# Patient Record
Sex: Male | Born: 1965
Health system: Southern US, Community
[De-identification: ages and names within clinical notes are randomized; demographics above are authoritative.]

## PROBLEM LIST (undated history)

## (undated) DIAGNOSIS — M549 Dorsalgia, unspecified: Secondary | ICD-10-CM

## (undated) DIAGNOSIS — E785 Hyperlipidemia, unspecified: Secondary | ICD-10-CM

## (undated) DIAGNOSIS — G8929 Other chronic pain: Secondary | ICD-10-CM

## (undated) DIAGNOSIS — G4730 Sleep apnea, unspecified: Secondary | ICD-10-CM

## (undated) DIAGNOSIS — I10 Essential (primary) hypertension: Secondary | ICD-10-CM

## (undated) HISTORY — DX: Sleep apnea, unspecified: G47.30

## (undated) HISTORY — DX: Essential (primary) hypertension: I10

## (undated) HISTORY — DX: Hyperlipidemia, unspecified: E78.5

---
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
## 2014-07-22 ENCOUNTER — Emergency Department (HOSPITAL_COMMUNITY)
Admission: EM | Admit: 2014-07-22 | Discharge: 2014-07-22 | Disposition: A | Discharge status: Home / Self Care | Payer: Self-pay | Attending: Emergency Medicine | Admitting: Emergency Medicine

## 2014-07-22 ENCOUNTER — Encounter (HOSPITAL_COMMUNITY): Payer: Self-pay | Admitting: *Deleted

## 2014-07-22 DIAGNOSIS — J069 Acute upper respiratory infection, unspecified: Secondary | ICD-10-CM | POA: Insufficient documentation

## 2014-07-22 DIAGNOSIS — G8929 Other chronic pain: Secondary | ICD-10-CM | POA: Insufficient documentation

## 2014-07-22 DIAGNOSIS — Z87891 Personal history of nicotine dependence: Secondary | ICD-10-CM | POA: Insufficient documentation

## 2014-07-22 HISTORY — DX: Dorsalgia, unspecified: M54.9

## 2014-07-22 HISTORY — DX: Other chronic pain: G89.29

## 2014-07-22 NOTE — Discharge Instructions (Signed)
Upper Respiratory Infection, Adult °An upper respiratory infection (URI) is also sometimes known as the common cold. The upper respiratory tract includes the nose, sinuses, throat, trachea, and bronchi. Bronchi are the airways leading to the lungs. Most people improve within 1 week, but symptoms can last up to 2 weeks. A residual cough may last even longer.  °CAUSES °Many different viruses can infect the tissues lining the upper respiratory tract. The tissues become irritated and inflamed and often become very moist. Mucus production is also common. A cold is contagious. You can easily spread the virus to others by oral contact. This includes kissing, sharing a glass, coughing, or sneezing. Touching your mouth or nose and then touching a surface, which is then touched by another person, can also spread the virus. °SYMPTOMS  °Symptoms typically develop 1 to 3 days after you come in contact with a cold virus. Symptoms vary from person to person. They may include: °· Runny nose. °· Sneezing. °· Nasal congestion. °· Sinus irritation. °· Sore throat. °· Loss of voice (laryngitis). °· Cough. °· Fatigue. °· Muscle aches. °· Loss of appetite. °· Headache. °· Low-grade fever. °DIAGNOSIS  °You might diagnose your own cold based on familiar symptoms, since most people get a cold 2 to 3 times a year. Your caregiver can confirm this based on your exam. Most importantly, your caregiver can check that your symptoms are not due to another disease such as strep throat, sinusitis, pneumonia, asthma, or epiglottitis. Blood tests, throat tests, and X-rays are not necessary to diagnose a common cold, but they may sometimes be helpful in excluding other more serious diseases. Your caregiver will decide if any further tests are required. °RISKS AND COMPLICATIONS  °You may be at risk for a more severe case of the common cold if you smoke cigarettes, have chronic heart disease (such as heart failure) or lung disease (such as asthma), or if  you have a weakened immune system. The very young and very old are also at risk for more serious infections. Bacterial sinusitis, middle ear infections, and bacterial pneumonia can complicate the common cold. The common cold can worsen asthma and chronic obstructive pulmonary disease (COPD). Sometimes, these complications can require emergency medical care and may be life-threatening. °PREVENTION  °The best way to protect against getting a cold is to practice good hygiene. Avoid oral or hand contact with people with cold symptoms. Wash your hands often if contact occurs. There is no clear evidence that vitamin C, vitamin E, echinacea, or exercise reduces the chance of developing a cold. However, it is always recommended to get plenty of rest and practice good nutrition. °TREATMENT  °Treatment is directed at relieving symptoms. There is no cure. Antibiotics are not effective, because the infection is caused by a virus, not by bacteria. Treatment may include: °· Increased fluid intake. Sports drinks offer valuable electrolytes, sugars, and fluids. °· Breathing heated mist or steam (vaporizer or shower). °· Eating chicken soup or other clear broths, and maintaining good nutrition. °· Getting plenty of rest. °· Using gargles or lozenges for comfort. °· Controlling fevers with ibuprofen or acetaminophen as directed by your caregiver. °· Increasing usage of your inhaler if you have asthma. °Zinc gel and zinc lozenges, taken in the first 24 hours of the common cold, can shorten the duration and lessen the severity of symptoms. Pain medicines may help with fever, muscle aches, and throat pain. A variety of non-prescription medicines are available to treat congestion and runny nose. Your caregiver   can make recommendations and may suggest nasal or lung inhalers for other symptoms.  HOME CARE INSTRUCTIONS   Only take over-the-counter or prescription medicines for pain, discomfort, or fever as directed by your  caregiver.  Use a warm mist humidifier or inhale steam from a shower to increase air moisture. This may keep secretions moist and make it easier to breathe.  Drink enough water and fluids to keep your urine clear or pale yellow.  Rest as needed.  Return to work when your temperature has returned to normal or as your caregiver advises. You may need to stay home longer to avoid infecting others. You can also use a face mask and careful hand washing to prevent spread of the virus. SEEK MEDICAL CARE IF:   After the first few days, you feel you are getting worse rather than better.  You need your caregiver's advice about medicines to control symptoms.  You develop chills, worsening shortness of breath, or brown or red sputum. These may be signs of pneumonia.  You develop yellow or brown nasal discharge or pain in the face, especially when you bend forward. These may be signs of sinusitis.  You develop a fever, swollen neck glands, pain with swallowing, or white areas in the back of your throat. These may be signs of strep throat. SEEK IMMEDIATE MEDICAL CARE IF:   You have a fever.  You develop severe or persistent headache, ear pain, sinus pain, or chest pain.  You develop wheezing, a prolonged cough, cough up blood, or have a change in your usual mucus (if you have chronic lung disease).  You develop sore muscles or a stiff neck. Document Released: 10/11/2000 Document Revised: 07/10/2011 Document Reviewed: 07/23/2013 [facility] Patient Information 2015 [place], [place]. This information is not intended to replace advice given to you by your health care provider. Make sure you discuss any questions you have with your health care provider.  As discussed,  Rest and make sure you are drinking plenty of fluids.  Look for coricidin brand cold and congestion medication for your nasal congestion - this will not interfere with blood pressure.  Also, vicks vapor stick or rub, menthol cough drops or a  strong mint (such as altoids) can help relieve your congestion.  There is no sign of a sinus or bacterial infection today.  I suspect your symptoms are viral and should improve with time.

## 2014-07-22 NOTE — ED Provider Notes (Signed)
CSN: [id number]     Arrival date & time 07/22/14  1053 History   First MD Initiated Contact with Patient 07/22/14 1109     Chief Complaint  Patient presents with  . Nasal Congestion     (Consider location/radiation/quality/duration/timing/severity/associated sxs/prior Treatment) The history is provided by the patient.   Jon Washington is a 49 y.o. male presenting with a 2 day history of uri type symptoms which includes nasal congestion with clear rhinorrhea, sinus pressure, sore throat, low grade subjective fever with body aches and nonproductive cough.  Symptoms due to not include shortness of breath, chest pain, headache, Nausea, vomiting or diarrhea.  The patient has taken no medicines prior to arrival.    Past Medical History  Diagnosis Date  . Chronic back pain    Past Surgical History  Procedure Laterality Date  . Hernia repair    . Appendectomy     No family history on file. History  Substance Use Topics  . Smoking status: Former Smoker    Types: Cigarettes  . Smokeless tobacco: Not on file  . Alcohol Use: No    Review of Systems  Constitutional: Positive for fever and chills.  HENT: Positive for congestion, rhinorrhea, sinus pressure and sore throat. Negative for ear pain, trouble swallowing and voice change.   Eyes: Negative for discharge.  Respiratory: Positive for cough. Negative for shortness of breath, wheezing and stridor.   Cardiovascular: Negative for chest pain.  Gastrointestinal: Negative for abdominal pain.  Genitourinary: Negative.   Neurological: Negative for headaches.      Allergies  Review of patient's allergies indicates no known allergies.  Home Medications   Prior to Admission medications   Not on File   BP 152/92 mmHg  Pulse 86  Temp(Src) 97.9 F (36.6 C) (Oral)  Resp 16  Ht 6\' 1"  (1.854 m)  Wt 260 lb (117.935 kg)  BMI 34.31 kg/m2  SpO2 100% Physical Exam  Constitutional: He is oriented to person, place, and time. He  appears well-developed and well-nourished.  HENT:  Head: Normocephalic and atraumatic.  Right Ear: Tympanic membrane and ear canal normal.  Left Ear: Tympanic membrane and ear canal normal.  Nose: Mucosal edema and rhinorrhea present.  Mouth/Throat: Uvula is midline, oropharynx is clear and moist and mucous membranes are normal. No oropharyngeal exudate, posterior oropharyngeal edema, posterior oropharyngeal erythema or tonsillar abscesses.  No sinus pain with palpation.  Eyes: Conjunctivae are normal.  Cardiovascular: Normal rate and normal heart sounds.   Pulmonary/Chest: Effort normal. No respiratory distress. He has no wheezes. He has no rales.  Abdominal: Soft. There is no tenderness.  Musculoskeletal: Normal range of motion.  Neurological: He is alert and oriented to person, place, and time.  Skin: Skin is warm and dry. No rash noted.  Psychiatric: He has a normal mood and affect.    ED Course  Procedures (including critical care time) Labs Review Labs Reviewed - No data to display  Imaging Review No results found.   EKG Interpretation None      MDM   Final diagnoses:  Acute URI    Pt with uri complaints, suspect viral source.  Advised rest, increased fluid intake.  Suggested coricidin d which will not interfere with bp.  vicks vapor stick, menthol cough lozenges, time.  The patient appears reasonably screened and/or stabilized for discharge and I doubt any other medical condition or other [facility] requiring further screening, evaluation, or treatment in the ED at this time prior to discharge.  Burgess Amor, PA-C 07/22/14 1643  Margarita Grizzle, MD 07/23/14 [phone]

## 2014-07-22 NOTE — ED Notes (Signed)
Pt states bodyaches, sinus pressure, fever , stating going between being hot and chills. Symptoms since Sunday. Drainage is yellow in color. Pt states he will need a doctors note for work today also.

## 2014-08-01 ENCOUNTER — Emergency Department (HOSPITAL_COMMUNITY)
Admission: EM | Admit: 2014-08-01 | Discharge: 2014-08-01 | Disposition: A | Discharge status: Home / Self Care | Payer: Self-pay | Attending: Emergency Medicine | Admitting: Emergency Medicine

## 2014-08-01 ENCOUNTER — Emergency Department (HOSPITAL_COMMUNITY): Payer: Self-pay

## 2014-08-01 ENCOUNTER — Encounter (HOSPITAL_COMMUNITY): Payer: Self-pay

## 2014-08-01 DIAGNOSIS — G8929 Other chronic pain: Secondary | ICD-10-CM | POA: Insufficient documentation

## 2014-08-01 DIAGNOSIS — B349 Viral infection, unspecified: Secondary | ICD-10-CM

## 2014-08-01 DIAGNOSIS — Z87891 Personal history of nicotine dependence: Secondary | ICD-10-CM | POA: Insufficient documentation

## 2014-08-01 MED ORDER — ALBUTEROL SULFATE (2.5 MG/3ML) 0.083% IN NEBU
2.5000 mg | INHALATION_SOLUTION | Freq: Once | RESPIRATORY_TRACT | Status: AC
  Administered 2014-08-01: 2.5 mg via RESPIRATORY_TRACT
  Filled 2014-08-01: qty 3

## 2014-08-01 MED ORDER — ALBUTEROL SULFATE HFA 108 (90 BASE) MCG/ACT IN AERS
2.0000 | INHALATION_SPRAY | Freq: Once | RESPIRATORY_TRACT | Status: AC
  Administered 2014-08-01: 2 via RESPIRATORY_TRACT
  Filled 2014-08-01: qty 6.7

## 2014-08-01 MED ORDER — PREDNISONE 50 MG PO TABS
60.0000 mg | ORAL_TABLET | Freq: Once | ORAL | Status: AC
  Administered 2014-08-01: 60 mg via ORAL
  Filled 2014-08-01 (×2): qty 1

## 2014-08-01 MED ORDER — PREDNISONE 50 MG PO TABS: ORAL_TABLET | ORAL | Status: DC

## 2014-08-01 MED ORDER — IPRATROPIUM-ALBUTEROL 0.5-2.5 (3) MG/3ML IN SOLN
3.0000 mL | Freq: Once | RESPIRATORY_TRACT | Status: AC
  Administered 2014-08-01: 3 mL via RESPIRATORY_TRACT
  Filled 2014-08-01: qty 3

## 2014-08-01 NOTE — ED Notes (Signed)
Pt c/o of generalized body aches, productive yellow sputum and temp at home.

## 2014-08-01 NOTE — Discharge Instructions (Signed)
X-ray shows no pneumonia. Increase fluids. Alternate Tylenol and ibuprofen every 3 hours. Use your inhaler 2 puffs every 3 hours. Prescription for prednisone. Start tomorrow.

## 2014-08-01 NOTE — ED Provider Notes (Signed)
CSN: [id number]     Arrival date & time 08/01/14  1004 History  This chart was scribed for Donnetta HutchingBrian Inocente Krach, MD by Jarvis Morganaylor Ferguson, ED Scribe. This patient was seen in room APA11/APA11 and the patient's care was started at 10:33 AM.     Chief Complaint  Patient presents with  . Generalized Body Aches    The history is provided by the patient. No language interpreter was used.    HPI Comments: Jon Washington is a 49 y.o. male who presents to the Emergency Department complaining of  generalized body aches that began 3 days ago. He is having associated productive cough, wheezing, fever (t-max 103 F), chills and sweats. Pt states his daughter has pneumonia and his wife has the flu. He denies any health issues. Pt reports he took Tylenol yesterday which provided relief for the fever. He denies any sore throat or otalgia  Past Medical History  Diagnosis Date  . Chronic back pain    Past Surgical History  Procedure Laterality Date  . Hernia repair    . Appendectomy     History reviewed. No pertinent family history. History  Substance Use Topics  . Smoking status: Former Smoker    Types: Cigarettes  . Smokeless tobacco: Not on file  . Alcohol Use: No    Review of Systems  A complete 10 system review of systems was obtained and all systems are negative except as noted in the HPI and PMH.     Allergies  Review of patient's allergies indicates no known allergies.  Home Medications   Prior to Admission medications   Medication Sig Start Date End Date Taking? Authorizing Provider  predniSONE (DELTASONE) 50 MG tablet 1 tablet for 6 days, one half tablet for 6 days 08/01/14   Donnetta HutchingBrian Unity Luepke, MD   Triage Vitals: BP 143/82 mmHg  Pulse 99  Temp(Src) 98 F (36.7 C) (Oral)  Resp 16  Ht 6\' 1"  (1.854 m)  Wt 236 lb (107.049 kg)  BMI 31.14 kg/m2  SpO2 100%  Physical Exam  Constitutional: He is oriented to person, place, and time. He appears well-developed and well-nourished.  HENT:  Head:  Normocephalic and atraumatic.  Eyes: Conjunctivae and EOM are normal. Pupils are equal, round, and reactive to light.  Neck: Normal range of motion. Neck supple.  Cardiovascular: Normal rate and regular rhythm.   Pulmonary/Chest: Effort normal and breath sounds normal.  Abdominal: Soft. Bowel sounds are normal.  Musculoskeletal: Normal range of motion.  Neurological: He is alert and oriented to person, place, and time.  Skin: Skin is warm and dry.  Psychiatric: He has a normal mood and affect. His behavior is normal.  Nursing note and vitals reviewed.   ED Course  Procedures (including critical care time)  DIAGNOSTIC STUDIES: Oxygen Saturation is 100% on RA, normal by my interpretation.    COORDINATION OF CARE: 10:38 AM Advised fluids, rest and alternating Tylenol and Ibuprofen for his symptoms. Will CXR and breathing treatment  Labs Review Labs Reviewed - No data to display  Imaging Review Dg Chest 2 View  08/01/2014   CLINICAL DATA:  Generalized body aches, fever and cough.  EXAM: CHEST  2 VIEW  COMPARISON:  None.  FINDINGS: Few prominent densities in the right lower chest probably represent overlying shadows. Otherwise, the lungs are clear. Heart size is normal. Negative for pleural effusions. No acute bone abnormality.  IMPRESSION: No active cardiopulmonary disease.   Electronically Signed   By: Meriel PicaAdam  Henn M.D.  On: 08/01/2014 11:46    MDM   Final diagnoses:  Viral syndrome   Patient is nontoxic-appearing. Chest x-ray negative. Vital signs stable. Albuterol/Atrovent nebulizer treatment given. Discharge medications prednisone and albuterol inhaler.  I personally performed the services described in this documentation, which was scribed in my presence. The recorded information has been reviewed and is accurate.     Donnetta Hutching, MD 08/01/14 1209

## 2016-06-12 ENCOUNTER — Encounter: Payer: Self-pay | Admitting: Family Medicine

## 2016-06-12 ENCOUNTER — Ambulatory Visit (INDEPENDENT_AMBULATORY_CARE_PROVIDER_SITE_OTHER): Payer: Medicaid Other | Admitting: Family Medicine

## 2016-06-12 DIAGNOSIS — R030 Elevated blood-pressure reading, without diagnosis of hypertension: Secondary | ICD-10-CM | POA: Diagnosis not present

## 2016-06-12 DIAGNOSIS — E663 Overweight: Secondary | ICD-10-CM | POA: Diagnosis not present

## 2016-06-12 DIAGNOSIS — E669 Obesity, unspecified: Secondary | ICD-10-CM | POA: Insufficient documentation

## 2016-06-12 DIAGNOSIS — Z7689 Persons encountering health services in other specified circumstances: Secondary | ICD-10-CM | POA: Diagnosis not present

## 2016-06-12 DIAGNOSIS — Z8249 Family history of ischemic heart disease and other diseases of the circulatory system: Secondary | ICD-10-CM

## 2016-06-12 DIAGNOSIS — R7309 Other abnormal glucose: Secondary | ICD-10-CM

## 2016-06-12 NOTE — Patient Instructions (Signed)
Come back for a physical and EKG Need blood work and a chest x ray  Exercise every day that you are able  We will schedule colon cancer screening

## 2016-06-12 NOTE — Progress Notes (Addendum)
Chief Complaint  Patient presents with  . Establish Care   New patient to establish. No old records are available. He hasn't seen a doctor for 8-10 years. He states he previously had hypertension but is no longer on medication. He is currently unemployed. He states that he hasn't been as active since not working. He has gained weight. He knows that he is overweight. He has no specific complaint. Known rotator cuff disease on the right. Occasional shoulder pain. He has not had colon cancer screening. He has not had any immunizations recently. Because his sister died in her sleep at age 35, she had known coronary artery disease. He had another family member die in their 60s from a heart attack. He understands that with a family history of heart disease at young age he needs to be screened for this. He has never been tested for or treated for hyperlipidemia. He has never been told he is diabetic. He was a smoker previously but quit many years ago. He does live with a smoker. He is not on any particular diet. He does not get any regular exercise.  Patient Active Problem List   Diagnosis Date Noted  . Family history of heart disease in male family member before age 49 06/12/2016  . Over weight 06/12/2016  . Elevated blood pressure reading 06/12/2016  . Encounter to establish care with new doctor 06/12/2016   Medications: None  Past Medical History:  Diagnosis Date  . Chronic back pain   . Hypertension   . Sleep apnea     Past Surgical History:  Procedure Laterality Date  . APPENDECTOMY  1974  . HERNIA REPAIR  1978    Social History   Social History  . Marital status: Married    Spouse name: Marisue Ivan  . Number of children: 3- all stepchildren   . Years of education: 32   Occupational History  . Holiday representative    Social History Main Topics  . Smoking status: Former Smoker    Packs/day: 1.00    Types: Cigarettes    Start date: 05/01/1980    Quit date: 05/01/1997  . Smokeless  tobacco: Never Used  . Alcohol use No  . Drug use: No  . Sexual activity: Yes    Birth control/ protection: None     Comment: wife PCOS   Other Topics Concern  . Not on file   Social History Narrative   Married   Wife [name] at home age 77   Veteran - [employer] 6 years    Family History  Problem Relation Age of Onset  . Thyroid disease Mother   . Alcohol abuse Father   . Arthritis Father   . Cancer Father     lymphoma  . COPD Father   . Heart disease Father     irreg beat  . Heart disease Sister 2    CAD  . Hypertension Sister   . Cancer Sister     hodgkins, breast, skin  . Breast cancer Sister   . Heart disease Maternal Uncle   . Mental illness Maternal Grandmother     social anxiety  . Heart disease Paternal Grandfather 55    heart attack    Review of Systems  Constitutional: Negative for chills, fever and weight loss.  HENT: Negative for congestion and hearing loss.   Eyes: Negative.  Negative for blurred vision and pain.       Wears glasses  Respiratory: Negative for cough and  shortness of breath.   Cardiovascular: Negative for chest pain and leg swelling.  Gastrointestinal: Negative for abdominal pain, constipation, diarrhea and heartburn.  Genitourinary: Negative for dysuria and frequency.  Musculoskeletal: Positive for joint pain. Negative for falls and myalgias.       Shoulder  Neurological: Negative for dizziness, seizures and headaches.  Psychiatric/Behavioral: Negative for depression. The patient is not nervous/anxious and does not have insomnia.     BP (!) 158/90 (BP Location: Right Arm, Patient Position: Sitting, Cuff Size: Large)   Pulse 68   Temp 98.6 F (37 C) (Temporal)   Resp 16   Ht 6\' 1"  (1.854 m)   Wt 262 lb 0.6 oz (118.9 kg)   SpO2 98%   BMI 34.57 kg/m   Physical Exam  Constitutional: He is oriented to person, place, and time. He appears well-developed and well-nourished.  Moderately overweight, rounded abdomen  HENT:  Head:  Normocephalic and atraumatic.  Right Ear: External ear normal.  Left Ear: External ear normal.  Mouth/Throat: Oropharynx is clear and moist.  Eyes: Conjunctivae are normal. Pupils are equal, round, and reactive to light.  Neck: Normal range of motion. Neck supple. No thyromegaly present.  Cardiovascular: Normal rate, regular rhythm and normal heart sounds.   Pulmonary/Chest: Effort normal and breath sounds normal. No respiratory distress.  Abdominal: Soft. Bowel sounds are normal.  Musculoskeletal: Normal range of motion. He exhibits no edema.  Lymphadenopathy:    He has no cervical adenopathy.  Neurological: He is alert and oriented to person, place, and time. He displays normal reflexes.  Gait normal  Skin: Skin is warm and dry.  Psychiatric: He has a normal mood and affect. His behavior is normal. Thought content normal.  Nursing note and vitals reviewed.  ASSESSMENT/PLAN:  1. Family history of heart disease in male family member before age 51  - CBC - Comprehensive metabolic panel - Lipid panel - VITAMIN D 25 Hydroxy (Vit-D Deficiency, Fractures) - Urinalysis, Routine w reflex microscopic - DG Chest 2 View; Future  2. Over weight   3. Elevated blood pressure reading   4. Encounter to establish care with new doctor    Patient Instructions  Come back for a physical and EKG Need blood work and a chest x ray  Exercise every day that you are able  We will schedule colon cancer screening   Eustace MooreYvonne Sue Nelson, MD

## 2016-06-13 ENCOUNTER — Encounter: Payer: Self-pay | Admitting: Family Medicine

## 2016-06-13 ENCOUNTER — Other Ambulatory Visit: Payer: Self-pay

## 2016-06-13 ENCOUNTER — Ambulatory Visit (HOSPITAL_COMMUNITY)
Admission: RE | Admit: 2016-06-13 | Discharge: 2016-06-13 | Disposition: A | Discharge status: Home / Self Care | Payer: Medicaid Other | Source: Ambulatory Visit | Attending: Family Medicine | Admitting: Family Medicine

## 2016-06-13 ENCOUNTER — Telehealth: Payer: Self-pay

## 2016-06-13 DIAGNOSIS — Z8249 Family history of ischemic heart disease and other diseases of the circulatory system: Secondary | ICD-10-CM | POA: Insufficient documentation

## 2016-06-13 DIAGNOSIS — E785 Hyperlipidemia, unspecified: Secondary | ICD-10-CM

## 2016-06-13 DIAGNOSIS — E663 Overweight: Secondary | ICD-10-CM | POA: Insufficient documentation

## 2016-06-13 DIAGNOSIS — Z7689 Persons encountering health services in other specified circumstances: Secondary | ICD-10-CM | POA: Insufficient documentation

## 2016-06-13 DIAGNOSIS — R7309 Other abnormal glucose: Secondary | ICD-10-CM

## 2016-06-13 DIAGNOSIS — I10 Essential (primary) hypertension: Secondary | ICD-10-CM | POA: Diagnosis present

## 2016-06-13 DIAGNOSIS — R030 Elevated blood-pressure reading, without diagnosis of hypertension: Secondary | ICD-10-CM | POA: Insufficient documentation

## 2016-06-13 DIAGNOSIS — E559 Vitamin D deficiency, unspecified: Secondary | ICD-10-CM | POA: Insufficient documentation

## 2016-06-13 DIAGNOSIS — E119 Type 2 diabetes mellitus without complications: Secondary | ICD-10-CM | POA: Insufficient documentation

## 2016-06-13 HISTORY — DX: Hyperlipidemia, unspecified: E78.5

## 2016-06-13 LAB — CBC
HCT: 43.4 % (ref 38.5–50.0)
Hemoglobin: 14.8 g/dL (ref 13.2–17.1)
MCH: 29.7 pg (ref 27.0–33.0)
MCHC: 34.1 g/dL (ref 32.0–36.0)
MCV: 87.1 fL (ref 80.0–100.0)
MPV: 10.6 fL (ref 7.5–12.5)
Platelets: 248 10*3/uL (ref 140–400)
RBC: 4.98 MIL/uL (ref 4.20–5.80)
RDW: 13.7 % (ref 11.0–15.0)
WBC: 7.7 10*3/uL (ref 3.8–10.8)

## 2016-06-13 LAB — COMPREHENSIVE METABOLIC PANEL
ALK PHOS: 67 U/L (ref 40–115)
ALT: 13 U/L (ref 9–46)
AST: 14 U/L (ref 10–35)
Albumin: 4.1 g/dL (ref 3.6–5.1)
BUN: 10 mg/dL (ref 7–25)
CO2: 24 mmol/L (ref 20–31)
CREATININE: 0.91 mg/dL (ref 0.70–1.33)
Calcium: 9.2 mg/dL (ref 8.6–10.3)
Chloride: 105 mmol/L (ref 98–110)
Glucose, Bld: 205 mg/dL — ABNORMAL HIGH (ref 65–99)
Potassium: 4 mmol/L (ref 3.5–5.3)
SODIUM: 138 mmol/L (ref 135–146)
TOTAL PROTEIN: 6.9 g/dL (ref 6.1–8.1)
Total Bilirubin: 0.3 mg/dL (ref 0.2–1.2)

## 2016-06-13 LAB — URINALYSIS, MICROSCOPIC ONLY
Bacteria, UA: NONE SEEN [HPF]
CRYSTALS: NONE SEEN [HPF]
Casts: NONE SEEN [LPF]
RBC / HPF: NONE SEEN RBC/HPF (ref <=2)
Squamous Epithelial / LPF: NONE SEEN [HPF] (ref <=5)
WBC UA: NONE SEEN WBC/HPF (ref <=5)
Yeast: NONE SEEN [HPF]

## 2016-06-13 LAB — URINALYSIS, ROUTINE W REFLEX MICROSCOPIC
Bilirubin Urine: NEGATIVE
Hgb urine dipstick: NEGATIVE
KETONES UR: NEGATIVE
Leukocytes, UA: NEGATIVE
Nitrite: NEGATIVE
Protein, ur: NEGATIVE
Specific Gravity, Urine: 1.021 (ref 1.001–1.035)
pH: 5.5 (ref 5.0–8.0)

## 2016-06-13 LAB — LIPID PANEL
Cholesterol: 228 mg/dL — ABNORMAL HIGH (ref <200)
HDL: 46 mg/dL (ref >40)
LDL Cholesterol: 140 mg/dL — ABNORMAL HIGH (ref <100)
Total CHOL/HDL Ratio: 5 Ratio — ABNORMAL HIGH (ref <5.0)
Triglycerides: 208 mg/dL — ABNORMAL HIGH (ref <150)
VLDL: 42 mg/dL — ABNORMAL HIGH (ref <30)

## 2016-06-13 LAB — VITAMIN D 25 HYDROXY (VIT D DEFICIENCY, FRACTURES): VIT D 25 HYDROXY: 13 ng/mL — AB (ref 30–100)

## 2016-06-13 NOTE — Addendum Note (Signed)
Addended by: Crawford GivensPUTMAN, Zong Mcquarrie M on: 06/13/2016 11:41 AM   Modules accepted: Orders

## 2016-06-13 NOTE — Telephone Encounter (Signed)
-----   Message from Eustace MooreYvonne Sue Nelson, MD sent at 06/13/2016 10:45 AM EST ----- Call lab and see if they can add an A1c

## 2016-06-15 ENCOUNTER — Encounter: Payer: Self-pay | Admitting: Family Medicine

## 2016-06-15 LAB — HEMOGLOBIN A1C
Hgb A1c MFr Bld: 6.7 % — ABNORMAL HIGH (ref <5.7)
MEAN PLASMA GLUCOSE: 146 mg/dL

## 2016-06-30 ENCOUNTER — Ambulatory Visit (INDEPENDENT_AMBULATORY_CARE_PROVIDER_SITE_OTHER): Payer: Medicaid Other | Admitting: Family Medicine

## 2016-06-30 ENCOUNTER — Encounter: Payer: Self-pay | Admitting: Family Medicine

## 2016-06-30 ENCOUNTER — Other Ambulatory Visit: Payer: Self-pay

## 2016-06-30 VITALS — BP 142/96 | HR 76 | Temp 98.8°F | Resp 18 | Ht 73.0 in | Wt 252.1 lb

## 2016-06-30 DIAGNOSIS — S46001D Unspecified injury of muscle(s) and tendon(s) of the rotator cuff of right shoulder, subsequent encounter: Secondary | ICD-10-CM

## 2016-06-30 DIAGNOSIS — X32XXXA Exposure to sunlight, initial encounter: Secondary | ICD-10-CM | POA: Diagnosis not present

## 2016-06-30 DIAGNOSIS — G8929 Other chronic pain: Secondary | ICD-10-CM

## 2016-06-30 DIAGNOSIS — Z0000 Encounter for general adult medical examination without abnormal findings: Secondary | ICD-10-CM

## 2016-06-30 DIAGNOSIS — Z1211 Encounter for screening for malignant neoplasm of colon: Secondary | ICD-10-CM

## 2016-06-30 DIAGNOSIS — L570 Actinic keratosis: Secondary | ICD-10-CM | POA: Diagnosis not present

## 2016-06-30 DIAGNOSIS — M25511 Pain in right shoulder: Secondary | ICD-10-CM

## 2016-06-30 MED ORDER — METHYLPREDNISOLONE ACETATE 80 MG/ML IJ SUSP
80.0000 mg | Freq: Once | INTRAMUSCULAR | Status: AC
  Administered 2016-06-30: 80 mg via INTRA_ARTICULAR

## 2016-06-30 MED ORDER — IBUPROFEN 800 MG PO TABS: 800.0000 mg | ORAL_TABLET | Freq: Three times a day (TID) | ORAL | 0 refills | Status: DC | PRN

## 2016-06-30 MED ORDER — ROSUVASTATIN CALCIUM 10 MG PO TABS: 10.0000 mg | ORAL_TABLET | Freq: Every day | ORAL | 3 refills | Status: DC

## 2016-06-30 NOTE — Patient Instructions (Addendum)
Congratulations on your weight loss Continue to eat a heart healthy diet, low in carbohydrates, sweets, cholesterol fats You and Marisue IvanLiz should both go to the diabetes educator Walk every day that you are able  Take crestor once a day in the evening  Your vitamin D is low.  You need to take an over the counter vitamin D supplement 2000 Units a day.  When your vitamin D is low you can have bone pain, muscle pain and balance problems.  Vitamin D is essential for healthy bones.  You can also increase the vitamin D in your diet by adding dairy products such as milk, cheese and yogurt, also fatty fish like tuna, mackerel and salmon, and some dark greens like collards, spinach and kale.    Wear sunscreen when outdoors  I have placed referral for colon cancer screening   Need labs and a visit in 3 months

## 2016-06-30 NOTE — Progress Notes (Signed)
Chief Complaint  Patient presents with  . Annual Exam   [facility] Has lost 10 lbs Labs reviewed Cholesterol is not favorable Discussed diet/exercise/medication Sugar was high, A1c 6.7. BP is down a bit He is vit D deficient  Chronic  R shoulder pain from old injury.  Known rotator cuff tear on old MRI .  Discussed exercise, NSAID, injection, ortho referral.  Patient Active Problem List   Diagnosis Date Noted  . Solar keratosis 06/30/2016  . Type 2 diabetes mellitus without complications (HCC) 06/13/2016  . HLD (hyperlipidemia) 06/13/2016  . Vitamin D deficiency 06/13/2016  . Family history of heart disease in male family member before age 51 06/12/2016  . Over weight 06/12/2016  . Elevated blood pressure reading 06/12/2016  . Encounter to establish care with new doctor 06/12/2016    Outpatient Encounter Prescriptions as of 06/30/2016  Medication Sig  . ibuprofen (ADVIL,MOTRIN) 800 MG tablet Take 1 tablet (800 mg total) by mouth every 8 (eight) hours as needed.  . rosuvastatin (CRESTOR) 10 MG tablet Take 1 tablet (10 mg total) by mouth daily.   Facility-Administered Encounter Medications as of 06/30/2016  Medication  . methylPREDNISolone acetate (DEPO-MEDROL) injection 80 mg    No Known Allergies  Review of Systems  Constitutional: Negative for activity change, appetite change and fatigue.  HENT: Negative for congestion and dental problem.   Eyes: Negative for photophobia and visual disturbance.  Respiratory: Negative for cough and shortness of breath.   Cardiovascular: Negative for chest pain, palpitations and leg swelling.  Gastrointestinal: Negative for constipation and diarrhea.  Genitourinary: Negative for difficulty urinating and frequency.  Musculoskeletal: Positive for arthralgias. Negative for back pain.       Right shoulder  Skin: Positive for rash.       Sun damage  Neurological: Negative for dizziness  and headaches.  Psychiatric/Behavioral: Negative for dysphoric mood. The patient is not nervous/anxious.     Physical Exam  BP (!) 142/96   Pulse 76   Temp 98.8 F (37.1 C) (Temporal)   Resp 18   Ht 6\' 1"  (1.854 m)   Wt 252 lb 1.9 oz (114.4 kg)   SpO2 98%   BMI 33.26 kg/m   General Appearance:    Alert, cooperative, no distress, appears stated age  Head:    Normocephalic, without obvious abnormality, atraumatic  Eyes:    PERRL, conjunctiva/corneas clear, EOM's intact, fundi    benign, both eyes       Ears:    Normal TM's and external ear canals, both ears  Nose:   Nares normal, septum midline, mucosa normal, no drainage   or sinus tenderness  Throat:   Lips, mucosa, and tongue normal; teeth and gums normal  Neck:   Supple, symmetrical, trachea midline, no adenopathy;       thyroid:  No enlargement/tenderness/nodules; no carotid   bruit   Back:     Symmetric, no curvature, ROM normal, no CVA tenderness  Lungs:     Clear to auscultation bilaterally, respirations unlabored  Chest wall:    No tenderness or deformity  Heart:    Regular rate and rhythm, S1 and S2 normal, no murmur, rub   or gallop.  EKG = NORMAL  Abdomen:     Soft, non-tender, bowel sounds active all four quadrants,    no masses, no organomegaly.  Genitalia:    Normal male without lesion, discharge or tenderness  Extremities:  Extremities normal, atraumatic, no cyanosis or edema  Pulses:   2+ and symmetric all extremities  Skin:   Skin color, texture, turgor normal, no rashes or lesions. Sun damage/dry skin and keratoses forearms and hands  Lymph nodes:   Cervical, supraclavicular, and axillary nodes normal  Neurologic:   Normal strength, sensation and reflexes      throughout   PROCEDURE: Time out Consent Posterior (    R    ) shoulder prepped and marked 1cc of 1% lidocaine wheal placed at injection site 3 cc of 1% lidocaine with 80 mg of DepoMedrol placed into subacromial space Patient tolerated  procedure well Post injection instructions reviewed. ICE reocmended   ASSESSMENT/PLAN:  1. Annual physical exam  - EKG 12-Lead  2. Screen for colon cancer * - Ambulatory referral to Gastroenterology  3. Solar keratosis SUNSCREEN  4. Chronic right shoulder pain  - methylPREDNISolone acetate (DEPO-MEDROL) injection 80 mg; Inject 1 mL (80 mg total) into the articular space once. - PR DRAIN/INJECT LARGE JOINT/BURSA  5. Rotator cuff injury, right, subsequent encounter     Patient Instructions  Congratulations on your weight loss Continue to eat a heart healthy diet, low in carbohydrates, sweets, cholesterol fats You and Marisue Ivan should both go to the diabetes educator Walk every day that you are able  Take crestor once a day in the evening  Your vitamin D is low.  You need to take an over the counter vitamin D supplement 2000 Units a day.  When your vitamin D is low you can have bone pain, muscle pain and balance problems.  Vitamin D is essential for healthy bones.  You can also increase the vitamin D in your diet by adding dairy products such as milk, cheese and yogurt, also fatty fish like tuna, mackerel and salmon, and some dark greens like collards, spinach and kale.    Wear sunscreen when outdoors  I have placed referral for colon cancer screening   Need labs and a visit in 3 months     Eustace Moore, MD

## 2016-07-21 ENCOUNTER — Encounter: Payer: Self-pay | Admitting: Family Medicine

## 2016-07-21 ENCOUNTER — Ambulatory Visit (INDEPENDENT_AMBULATORY_CARE_PROVIDER_SITE_OTHER): Payer: Medicaid Other | Admitting: Family Medicine

## 2016-07-21 VITALS — BP 124/72 | HR 86 | Temp 99.3°F | Resp 20 | Ht 73.0 in | Wt 252.0 lb

## 2016-07-21 DIAGNOSIS — J069 Acute upper respiratory infection, unspecified: Secondary | ICD-10-CM | POA: Diagnosis not present

## 2016-07-21 MED ORDER — BENZONATATE 200 MG PO CAPS: 200.0000 mg | ORAL_CAPSULE | Freq: Two times a day (BID) | ORAL | 0 refills | Status: DC | PRN

## 2016-07-21 MED ORDER — AMOXICILLIN-POT CLAVULANATE 875-125 MG PO TABS: 1.0000 | ORAL_TABLET | Freq: Two times a day (BID) | ORAL | 0 refills | Status: DC

## 2016-07-21 MED ORDER — FLUTICASONE PROPIONATE 50 MCG/ACT NA SUSP: 2.0000 | Freq: Every day | NASAL | 0 refills | Status: DC

## 2016-07-21 NOTE — Progress Notes (Signed)
Chief Complaint  Patient presents with  . Sinus Problem    x 3 days   As above, sinus pain and pressure and runny nose and PND for 3 days, getting worse.  Last night had chills and fever, but didn't take temp.  Also has a harsh cough producing yellow sputum.  Very tired.  headache and sore throat.  Ear pressure.  Nausea, no vomiting.  Patient Active Problem List   Diagnosis Date Noted  . Solar keratosis 06/30/2016  . Type 2 diabetes mellitus without complications (HCC) 06/13/2016  . HLD (hyperlipidemia) 06/13/2016  . Vitamin D deficiency 06/13/2016  . Family history of heart disease in male family member before age 165 06/12/2016  . Over weight 06/12/2016  . Elevated blood pressure reading 06/12/2016  . Encounter to establish care with new doctor 06/12/2016    Outpatient Encounter Prescriptions as of 07/21/2016  Medication Sig  . ibuprofen (ADVIL,MOTRIN) 800 MG tablet Take 1 tablet (800 mg total) by mouth every 8 (eight) hours as needed.  . rosuvastatin (CRESTOR) 10 MG tablet Take 1 tablet (10 mg total) by mouth daily.  Marland Kitchen. amoxicillin-clavulanate (AUGMENTIN) 875-125 MG tablet Take 1 tablet by mouth 2 (two) times daily.  . benzonatate (TESSALON) 200 MG capsule Take 1 capsule (200 mg total) by mouth 2 (two) times daily as needed for cough.  . fluticasone (FLONASE) 50 MCG/ACT nasal spray Place 2 sprays into both nostrils daily.   No facility-administered encounter medications on file as of 07/21/2016.     No Known Allergies  Review of Systems  Constitutional: Positive for activity change, diaphoresis, fatigue and fever.  HENT: Positive for congestion, ear pain, postnasal drip, rhinorrhea, sinus pain, sinus pressure and sore throat.   Eyes: Negative for redness and visual disturbance.  Respiratory: Positive for cough. Negative for shortness of breath and wheezing.   Cardiovascular: Negative for chest pain and palpitations.  Gastrointestinal: Positive for nausea. Negative for  vomiting.  Musculoskeletal: Negative for arthralgias and myalgias.  Neurological: Positive for headaches. Negative for dizziness.  Hematological: Negative for adenopathy. Does not bruise/bleed easily.  Psychiatric/Behavioral: Positive for sleep disturbance.    BP 124/72 (BP Location: Left Arm, Patient Position: Sitting, Cuff Size: Large)   Pulse 86   Temp 99.3 F (37.4 C) (Temporal)   Resp 20   Ht 6\' 1"  (1.854 m)   Wt 252 lb 0.6 oz (114.3 kg)   SpO2 98%   BMI 33.25 kg/m   Physical Exam  Constitutional: He is oriented to person, place, and time. He appears well-developed and well-nourished. He appears distressed.  Moderately ill  HENT:  Head: Normocephalic and atraumatic.  Right Ear: External ear normal.  Left Ear: External ear normal.  Throat red, tonsils large.  Nasal membranes red and swollen.  No sinus tenderness  Eyes: Conjunctivae are normal. Pupils are equal, round, and reactive to light.  Neck: Normal range of motion. Neck supple. No thyromegaly present.  Cardiovascular: Normal rate, regular rhythm and normal heart sounds.   Pulmonary/Chest: Effort normal and breath sounds normal. No respiratory distress. He has no wheezes.  Abdominal: Soft. Bowel sounds are normal.  Musculoskeletal: Normal range of motion. He exhibits no edema.  Lymphadenopathy:    He has no cervical adenopathy.  Neurological: He is alert and oriented to person, place, and time.  Skin: Skin is warm and dry.  Psychiatric: He has a normal mood and affect. His behavior is normal. Thought content normal.  Nursing note and vitals reviewed.  ASSESSMENT/PLAN:  1. URI with cough and congestion Discussed CDC recommendations for treatment of sinus infections and colds.  Symptomatic care for first 8-10 days.  Antibiotics only for persistent or severe symptoms.  Patient is given a RX fo r augmentin to take if appropriate or if worsens   Patient Instructions  Push fluids Take the antibiotic for severe  symptoms May use salt water gargles for throat pain May use saline washed for nasal congestion Use flonase or nasal spray for congestion Take the tessalon for cough  Call if not better in a few days   Eustace Moore, MD

## 2016-07-21 NOTE — Patient Instructions (Signed)
Push fluids Take the antibiotic for severe symptoms May use salt water gargles for throat pain May use saline washed for nasal congestion Use flonase or nasal spray for congestion Take the tessalon for cough  Call if not better in a few days

## 2016-10-02 ENCOUNTER — Ambulatory Visit: Payer: Medicaid Other | Admitting: Family Medicine

## 2016-10-06 ENCOUNTER — Ambulatory Visit: Payer: Medicaid Other | Admitting: Family Medicine

## 2016-10-20 ENCOUNTER — Ambulatory Visit (INDEPENDENT_AMBULATORY_CARE_PROVIDER_SITE_OTHER): Payer: Medicaid Other | Admitting: Family Medicine

## 2016-10-20 ENCOUNTER — Encounter: Payer: Self-pay | Admitting: Family Medicine

## 2016-10-20 VITALS — BP 136/86 | HR 72 | Temp 97.6°F | Resp 18 | Ht 73.0 in | Wt 232.1 lb

## 2016-10-20 DIAGNOSIS — E559 Vitamin D deficiency, unspecified: Secondary | ICD-10-CM

## 2016-10-20 DIAGNOSIS — E785 Hyperlipidemia, unspecified: Secondary | ICD-10-CM | POA: Diagnosis not present

## 2016-10-20 DIAGNOSIS — E119 Type 2 diabetes mellitus without complications: Secondary | ICD-10-CM

## 2016-10-20 NOTE — Patient Instructions (Signed)
You are doing well BP is good Congratulations on losing weight Need updated labs to check on the sugar and cholesterol  See me in  3 months Call sooner for problems

## 2016-10-20 NOTE — Progress Notes (Signed)
    Chief Complaint  Patient presents with  . Follow-up    3 month  Is working full time Is eating better Has lost 30 lbs Feels good BP is controlled Need updated labs to check sugar and cholesterol Compliant with medicine Stopped his vitamin D   Patient Active Problem List   Diagnosis Date Noted  . Solar keratosis 06/30/2016  . Type 2 diabetes mellitus without complications (HCC) 06/13/2016  . HLD (hyperlipidemia) 06/13/2016  . Vitamin D deficiency 06/13/2016  . Family history of heart disease in male family member before age 51 06/12/2016  . Over weight 06/12/2016  . Elevated blood pressure reading 06/12/2016  . Encounter to establish care with new doctor 06/12/2016    Outpatient Encounter Prescriptions as of 10/20/2016  Medication Sig  . rosuvastatin (CRESTOR) 10 MG tablet Take 1 tablet (10 mg total) by mouth daily.   No facility-administered encounter medications on file as of 10/20/2016.     No Known Allergies  Review of Systems  Constitutional: Negative for activity change and appetite change.  HENT: Negative for congestion and dental problem.   Eyes: Negative for photophobia and visual disturbance.  Respiratory: Negative for shortness of breath and wheezing.   Cardiovascular: Negative for chest pain, palpitations and leg swelling.  Gastrointestinal: Negative for blood in stool, constipation and diarrhea.  Endocrine: Negative for polydipsia.  Genitourinary: Negative for dysuria and frequency.  Musculoskeletal: Positive for back pain. Negative for arthralgias.       Rare low back  Neurological: Negative for dizziness and headaches.  Psychiatric/Behavioral: Negative for dysphoric mood and sleep disturbance. The patient is not nervous/anxious.     BP 136/86 (BP Location: Right Arm, Patient Position: Sitting, Cuff Size: Large)   Pulse 72   Temp 97.6 F (36.4 C) (Temporal)   Resp 18   Ht 6\' 1"  (1.854 m)   Wt 232 lb 1.9 oz (105.3 kg)   SpO2 100%   BMI 30.62  kg/m   Physical Exam  Constitutional: He is oriented to person, place, and time. He appears well-developed and well-nourished.  HENT:  Head: Normocephalic and atraumatic.  Mouth/Throat: Oropharynx is clear and moist.  Eyes: Conjunctivae are normal. Pupils are equal, round, and reactive to light.  Neck: Normal range of motion. Neck supple. No thyromegaly present.  Cardiovascular: Normal rate, regular rhythm and normal heart sounds.   Pulmonary/Chest: Effort normal and breath sounds normal. No respiratory distress.  Abdominal: Soft. Bowel sounds are normal.  Musculoskeletal: Normal range of motion. He exhibits no edema.  Lymphadenopathy:    He has no cervical adenopathy.  Neurological: He is alert and oriented to person, place, and time.  Gait normal  Skin: Skin is warm and dry.  Psychiatric: He has a normal mood and affect. His behavior is normal. Thought content normal.  Nursing note and vitals reviewed.   ASSESSMENT/PLAN:  1. Type 2 diabetes mellitus without complication, without long-term current use of insulin (HCC)  - Hemoglobin A1c - Lipid panel  2. Vitamin D deficiency  - VITAMIN D 25 Hydroxy (Vit-D Deficiency, Fractures)  3. Hyperlipidemia, unspecified hyperlipidemia type Taking crestor   Patient Instructions  You are doing well BP is good Congratulations on losing weight Need updated labs to check on the sugar and cholesterol  See me in  3 months Call sooner for problems    Eustace MooreYvonne Sue Kayin Kettering, MD

## 2016-10-21 LAB — HEMOGLOBIN A1C
Hgb A1c MFr Bld: 6 % — ABNORMAL HIGH (ref <5.7)
Mean Plasma Glucose: 126 mg/dL

## 2016-10-21 LAB — LIPID PANEL
CHOL/HDL RATIO: 3.8 ratio (ref <5.0)
Cholesterol: 159 mg/dL (ref <200)
HDL: 42 mg/dL (ref >40)
LDL CALC: 95 mg/dL (ref <100)
Triglycerides: 112 mg/dL (ref <150)
VLDL: 22 mg/dL (ref <30)

## 2016-10-21 LAB — VITAMIN D 25 HYDROXY (VIT D DEFICIENCY, FRACTURES): Vit D, 25-Hydroxy: 23 ng/mL — ABNORMAL LOW (ref 30–100)

## 2016-10-23 ENCOUNTER — Encounter: Payer: Self-pay | Admitting: Family Medicine

## 2016-11-07 ENCOUNTER — Emergency Department (HOSPITAL_COMMUNITY): Payer: Medicaid Other

## 2016-11-07 ENCOUNTER — Telehealth: Payer: Self-pay | Admitting: Family Medicine

## 2016-11-07 ENCOUNTER — Emergency Department (HOSPITAL_COMMUNITY)
Admission: EM | Admit: 2016-11-07 | Discharge: 2016-11-07 | Disposition: A | Discharge status: Home / Self Care | Payer: Medicaid Other | Attending: Emergency Medicine | Admitting: Emergency Medicine

## 2016-11-07 ENCOUNTER — Encounter (HOSPITAL_COMMUNITY): Payer: Self-pay

## 2016-11-07 DIAGNOSIS — Z79899 Other long term (current) drug therapy: Secondary | ICD-10-CM | POA: Diagnosis not present

## 2016-11-07 DIAGNOSIS — Z87891 Personal history of nicotine dependence: Secondary | ICD-10-CM | POA: Diagnosis not present

## 2016-11-07 DIAGNOSIS — I10 Essential (primary) hypertension: Secondary | ICD-10-CM | POA: Diagnosis not present

## 2016-11-07 DIAGNOSIS — Y999 Unspecified external cause status: Secondary | ICD-10-CM | POA: Diagnosis not present

## 2016-11-07 DIAGNOSIS — W228XXA Striking against or struck by other objects, initial encounter: Secondary | ICD-10-CM | POA: Diagnosis not present

## 2016-11-07 DIAGNOSIS — Y929 Unspecified place or not applicable: Secondary | ICD-10-CM | POA: Diagnosis not present

## 2016-11-07 DIAGNOSIS — E119 Type 2 diabetes mellitus without complications: Secondary | ICD-10-CM | POA: Insufficient documentation

## 2016-11-07 DIAGNOSIS — Y9389 Activity, other specified: Secondary | ICD-10-CM | POA: Diagnosis not present

## 2016-11-07 DIAGNOSIS — S6991XA Unspecified injury of right wrist, hand and finger(s), initial encounter: Secondary | ICD-10-CM | POA: Insufficient documentation

## 2016-11-07 NOTE — Discharge Instructions (Signed)
Elevate and apply ice packs on/off to your finger.  Wear the splint as needed for one week.  Ibuprofen if needed for pain.  Call the orthopedic doctor listed to arrange follow-up if not improving

## 2016-11-07 NOTE — Telephone Encounter (Signed)
Unlikely to need x ray without trauma.  I can see him if he likes.  He can use ice, elevation, ace wrap and watch if he desires.

## 2016-11-07 NOTE — Telephone Encounter (Signed)
New Message  Pt voiced he injured his hand at home and needing some advice on if he needs an xray or if he needs to come to the office for a possible evaluation.  Please f/u

## 2016-11-07 NOTE — ED Triage Notes (Signed)
Pt reports he ws sung pipe wrench and injured right hand knuckle area approx one hour ago. Pt reports pain with gripping. Noted to be swollen

## 2016-11-07 NOTE — Telephone Encounter (Signed)
Called patient to find out how he injured his hand. He states he was just doing work around the house, using a Economistpipe wrench and pushed forward, felt his left middle knuckle pop, he describes it as 'lightning' through his had. He states he thought it was ligament or tendon that popped. I asked if he could move the finger, he states he can still move it but it is swollen and painful.   Please advise on whether patient needs urgent care, or appt with PCP

## 2016-11-07 NOTE — Telephone Encounter (Signed)
Called patient regarding message below. No answer, left generic message for patient to return call.   

## 2016-11-07 NOTE — Telephone Encounter (Signed)
Spoke with patient and offered 9:40 same day appt for hand injury if he would call and ask for me or Chrissie [name] tomorrow.  Also Friday appt is available but he declined and feel it may be best to go to ER this afternoon.  He will f/u with Delton SeeNelson if needed.

## 2016-11-07 NOTE — ED Provider Notes (Signed)
[facility]-EMERGENCY DEPT Provider Note   CSN: [id number] Arrival date & time: 11/07/16  1510     History   Chief Complaint Chief Complaint  Patient presents with  . Hand Pain    HPI Jon Washington is a 51 y.o. male.  HPI   Jon Washington is a 51 y.o. male who presents to the Emergency Department complaining of right middle finger pain and swelling at the knuckle.  He states the he was using a pipe wrench when his hand slipped, striking a piece of metal.   He states that he felt a sharp pain to the finger that is worse with movement and he has noticed swelling to the knuckle area.  He denies pain to his dorsal hand or wrist, numbness or open wound.     Past Medical History:  Diagnosis Date  . Chronic back pain   . HLD (hyperlipidemia) 06/13/2016  . Hypertension   . Sleep apnea   . Type 2 diabetes mellitus without complications (HCC) 06/13/2016    Patient Active Problem List   Diagnosis Date Noted  . Solar keratosis 06/30/2016  . Type 2 diabetes mellitus without complications (HCC) 06/13/2016  . HLD (hyperlipidemia) 06/13/2016  . Vitamin D deficiency 06/13/2016  . Family history of heart disease in male family member before age 51 06/12/2016  . Over weight 06/12/2016  . Elevated blood pressure reading 06/12/2016  . Encounter to establish care with new doctor 06/12/2016    Past Surgical History:  Procedure Laterality Date  . APPENDECTOMY  1974  . HERNIA REPAIR  1978       Home Medications    Prior to Admission medications   Medication Sig Start Date End Date Taking? Authorizing Provider  rosuvastatin (CRESTOR) 10 MG tablet Take 1 tablet (10 mg total) by mouth daily. 06/30/16   Eustace MooreNelson, Yvonne Sue, MD    Family History Family History  Problem Relation Age of Onset  . Thyroid disease Mother   . Alcohol abuse Father   . Arthritis Father   . Cancer Father        lymphoma  . COPD Father   . Heart disease Father        irreg beat  . Heart disease  Sister [id number]       CAD  . Hypertension Sister   . Cancer Sister        hodgkins, breast, skin  . Breast cancer Sister   . Heart disease Maternal Uncle   . Mental illness Maternal Grandmother        social anxiety  . Heart disease Paternal Grandfather 55       heart attack  . SIDS Daughter     Social History Social History  Substance Use Topics  . Smoking status: Former Smoker    Packs/day: 1.00    Types: Cigarettes    Start date: 05/01/1980    Quit date: 05/01/1997  . Smokeless tobacco: Never Used  . Alcohol use No     Allergies   Patient has no known allergies.   Review of Systems Review of Systems  Constitutional: Negative for chills and fever.  Genitourinary: Negative for difficulty urinating and dysuria.  Musculoskeletal: Positive for arthralgias and joint swelling.  Skin: Negative for color change and wound.  Neurological: Negative for weakness and numbness.  All other systems reviewed and are negative.    Physical Exam Updated Vital Signs BP 116/80 (BP Location: Right Arm)   Pulse (!) 101   Temp 98 F (36.7  C) (Oral)   Resp 19   Ht 6\' 1"  (1.854 m)   Wt 104.3 kg (230 lb)   SpO2 100%   BMI 30.34 kg/m   Physical Exam  Constitutional: He is oriented to person, place, and time. He appears well-developed and well-nourished. No distress.  HENT:  Head: Normocephalic and atraumatic.  Cardiovascular: Normal rate, regular rhythm and intact distal pulses.   Pulmonary/Chest: Effort normal and breath sounds normal.  Musculoskeletal: He exhibits tenderness. He exhibits no edema.  ttp of the right proximal middle finger extending to the metacarpal head.  Mild edema.  No bruising or bony deformity.  Patient has full ROM. No tenderness proximally  Neurological: He is alert and oriented to person, place, and time. No sensory deficit. He exhibits normal muscle tone. Coordination normal.  Skin: Skin is warm and dry. Capillary refill takes less than 2 seconds.  Nursing note  and vitals reviewed.    ED Treatments / Results  Labs (all labs ordered are listed, but only abnormal results are displayed) Labs Reviewed - No data to display  EKG  EKG Interpretation None       Radiology Dg Hand Complete Right  Result Date: 11/07/2016 CLINICAL DATA:  Right third finger swelling and pain after injury today. EXAM: RIGHT HAND - COMPLETE 3+ VIEW COMPARISON:  None. FINDINGS: No fracture or dislocation. No suspicious focal osseous lesion. Mild osteoarthritis in the distal interphalangeal joints of the second and third right fingers and interphalangeal joint right thumb. No radiopaque foreign body. IMPRESSION: No fracture or malalignment.  Mild polyarticular osteoarthritis. Electronically Signed   By: Delbert Phenix M.D.   On: 11/07/2016 15:52    Procedures Procedures (including critical care time)  Medications Ordered in ED Medications - No data to display   Initial Impression / Assessment and Plan / ED Course  I have reviewed the triage vital signs and the nursing notes.  Pertinent labs & imaging results that were available during my care of the patient were reviewed by me and considered in my medical decision making (see chart for details).     XR neg for fx.  NV intact.  No motor deficits.   Finger splinted.  Pain improved, remains NV intact,  Agrees to elevate, ice and ibuprofen if needed,  Final Clinical Impressions(s) / ED Diagnoses   Final diagnoses:  Injury of finger of right hand, initial encounter    New Prescriptions New Prescriptions   No medications on file     Pauline Aus, Cordelia Poche 11/07/16 1624    Donnetta Hutching, MD 11/08/16 1825

## 2017-01-23 ENCOUNTER — Ambulatory Visit (INDEPENDENT_AMBULATORY_CARE_PROVIDER_SITE_OTHER): Payer: Medicaid Other | Admitting: Family Medicine

## 2017-01-23 ENCOUNTER — Encounter: Payer: Self-pay | Admitting: Family Medicine

## 2017-01-23 VITALS — BP 128/88 | HR 84 | Ht 73.0 in | Wt 224.0 lb

## 2017-01-23 DIAGNOSIS — S6991XD Unspecified injury of right wrist, hand and finger(s), subsequent encounter: Secondary | ICD-10-CM

## 2017-01-23 DIAGNOSIS — E663 Overweight: Secondary | ICD-10-CM

## 2017-01-23 DIAGNOSIS — E119 Type 2 diabetes mellitus without complications: Secondary | ICD-10-CM

## 2017-01-23 DIAGNOSIS — E785 Hyperlipidemia, unspecified: Secondary | ICD-10-CM | POA: Diagnosis not present

## 2017-01-23 DIAGNOSIS — Z23 Encounter for immunization: Secondary | ICD-10-CM | POA: Diagnosis not present

## 2017-01-23 NOTE — Progress Notes (Signed)
    Chief Complaint  Patient presents with  . Diabetes  . Hyperlipidemia  . Hypertension   Continues to lose weight BP is good He took himself off all meds Will check labs in Dec Agrees to flu shot today Hand injury in July that is still a bit painful and swollen Discussed need for colon cancer screening.  Pros and cons of prostate cancer screening.  Chooses not to do either  Patient Active Problem List   Diagnosis Date Noted  . Solar keratosis 06/30/2016  . Type 2 diabetes mellitus without complications (HCC) 06/13/2016  . HLD (hyperlipidemia) 06/13/2016  . Vitamin D deficiency 06/13/2016  . Family history of heart disease in male family member before age 62 06/12/2016  . Over weight 06/12/2016  . Elevated blood pressure reading 06/12/2016  . Encounter to establish care with new doctor 06/12/2016    Outpatient Encounter Prescriptions as of 01/23/2017  Medication Sig  . [DISCONTINUED] rosuvastatin (CRESTOR) 10 MG tablet Take 1 tablet (10 mg total) by mouth daily.   No facility-administered encounter medications on file as of 01/23/2017.     No Known Allergies  Review of Systems  Constitutional: Negative for activity change and appetite change.  HENT: Negative for congestion and dental problem.   Eyes: Negative for photophobia and visual disturbance.  Respiratory: Negative for shortness of breath and wheezing.   Cardiovascular: Negative for chest pain, palpitations and leg swelling.  Gastrointestinal: Negative for blood in stool, constipation and diarrhea.  Endocrine: Negative for polydipsia.  Genitourinary: Negative for dysuria and frequency.  Musculoskeletal: Positive for back pain. Negative for arthralgias.       Rare low back, occas shoulder  Neurological: Negative for dizziness and headaches.  Psychiatric/Behavioral: Negative for dysphoric mood and sleep disturbance. The patient is not nervous/anxious.    BP 128/88   Pulse 84   Ht  (1.854 m)   Wt 224 lb  (101.6 kg)   SpO2 99%   BMI 29.55 kg/m   Physical Exam  Constitutional: He is oriented to person, place, and time. He appears well-developed and well-nourished.  HENT:  Head: Normocephalic and atraumatic.  Mouth/Throat: Oropharynx is clear and moist.  Eyes: Pupils are equal, round, and reactive to light. Conjunctivae are normal.  Neck: Normal range of motion. Neck supple. No thyromegaly present.  Cardiovascular: Normal rate, regular rhythm and normal heart sounds.   Pulmonary/Chest: Effort normal and breath sounds normal. No respiratory distress.  Abdominal: Soft. Bowel sounds are normal.  Musculoskeletal: Normal range of motion. He exhibits no edema.  Right hand with prominent third MCP knuckle when flexed.  Nontender.  FROM.  Good strength.  X rays negative (prior)  Lymphadenopathy:    He has no cervical adenopathy.  Neurological: He is alert and oriented to person, place, and time.  Gait normal  Skin: Skin is warm and dry.  Psychiatric: He has a normal mood and affect. His behavior is normal. Thought content normal.  Nursing note and vitals reviewed.   ASSESSMENT/PLAN:  1. Hand injury, right, subsequent encounter - Ambulatory referral to Orthopedic Surgery  2. Over weight improving  3. Hyperlipidemia, unspecified hyperlipidemia type Stopped crestor,  Will check labs  4. Needs flu shot done 5. Diabetes  history  Patient Instructions  I have referred to orthopedic for the hand pain Congratulations on losing weight  Need labs and check up in December Call sooner for problems    Eustace Moore, MD

## 2017-01-23 NOTE — Patient Instructions (Signed)
I have referred to orthopedic for the hand pain Congratulations on losing weight  Need labs and check up in December Call sooner for problems

## 2017-03-02 ENCOUNTER — Ambulatory Visit (INDEPENDENT_AMBULATORY_CARE_PROVIDER_SITE_OTHER): Payer: Medicaid Other | Admitting: Family Medicine

## 2017-03-02 ENCOUNTER — Ambulatory Visit: Payer: Medicaid Other | Admitting: Orthopedic Surgery

## 2017-03-02 ENCOUNTER — Encounter: Payer: Self-pay | Admitting: Orthopedic Surgery

## 2017-03-02 DIAGNOSIS — M7541 Impingement syndrome of right shoulder: Secondary | ICD-10-CM

## 2017-03-02 DIAGNOSIS — M25511 Pain in right shoulder: Secondary | ICD-10-CM | POA: Diagnosis not present

## 2017-03-02 MED ORDER — METHYLPREDNISOLONE ACETATE 40 MG/ML IJ SUSP: 40.0000 mg | Freq: Once | INTRAMUSCULAR | Status: DC

## 2017-03-02 NOTE — Progress Notes (Signed)
Patient has known right shoulder rotator cuff disease with periodic shoulder pain.  He received an injection almost a year ago that gave him several months of relief.  His shoulder has started hurting again and he is here today requesting another cortisone shot.  He had no allergies or complications from the first injections.  Time out Consent Posterior (    right    ) shoulder prepped and marked 1cc of 1% lidocaine wheal placed at injection site 3 cc of 1% lidocaine with 40 mg of DepoMedrol placed into subacromial space Patient tolerated procedure well Post injection instructions reviewed.

## 2017-03-05 DIAGNOSIS — M25511 Pain in right shoulder: Secondary | ICD-10-CM | POA: Diagnosis not present

## 2017-03-05 DIAGNOSIS — M7541 Impingement syndrome of right shoulder: Secondary | ICD-10-CM | POA: Diagnosis not present

## 2017-03-05 MED ORDER — METHYLPREDNISOLONE ACETATE 80 MG/ML IJ SUSP
80.0000 mg | Freq: Once | INTRAMUSCULAR | Status: AC
  Administered 2017-03-05: 80 mg via INTRA_ARTICULAR

## 2017-03-05 NOTE — Addendum Note (Signed)
Addended by: Abner GreenspanHUDY, Stalin Gruenberg H on: 03/05/2017 08:51 AM   Modules accepted: Orders

## 2017-04-19 ENCOUNTER — Ambulatory Visit: Payer: Medicaid Other | Admitting: Family Medicine

## 2017-04-27 ENCOUNTER — Ambulatory Visit: Payer: Self-pay | Admitting: Family Medicine

## 2017-06-19 ENCOUNTER — Telehealth: Payer: Self-pay | Admitting: Family Medicine

## 2017-06-19 NOTE — Telephone Encounter (Signed)
Patient is requesting a cortisone shot in this right shoulder   [phone] Lanora Manis(Elizabeth -wife)

## 2017-06-19 NOTE — Telephone Encounter (Signed)
Scheduled

## 2017-06-19 NOTE — Telephone Encounter (Signed)
Ok to sch

## 2017-06-20 ENCOUNTER — Telehealth: Payer: Self-pay | Admitting: Family Medicine

## 2017-06-20 NOTE — Telephone Encounter (Signed)
Pt has an appt for Friday already.

## 2017-06-20 NOTE — Telephone Encounter (Signed)
Pt is calling regarding a Cortisone shot in the shoulder--please call [phone]

## 2017-06-22 ENCOUNTER — Ambulatory Visit (INDEPENDENT_AMBULATORY_CARE_PROVIDER_SITE_OTHER): Payer: Self-pay | Admitting: Family Medicine

## 2017-06-22 ENCOUNTER — Other Ambulatory Visit: Payer: Self-pay

## 2017-06-22 ENCOUNTER — Encounter: Payer: Self-pay | Admitting: Family Medicine

## 2017-06-22 VITALS — BP 134/72 | HR 80 | Temp 98.4°F | Resp 18 | Ht 73.0 in | Wt 229.0 lb

## 2017-06-22 DIAGNOSIS — M75111 Incomplete rotator cuff tear or rupture of right shoulder, not specified as traumatic: Secondary | ICD-10-CM

## 2017-06-22 DIAGNOSIS — G8929 Other chronic pain: Secondary | ICD-10-CM

## 2017-06-22 DIAGNOSIS — M25511 Pain in right shoulder: Secondary | ICD-10-CM

## 2017-06-22 MED ORDER — METHYLPREDNISOLONE ACETATE 80 MG/ML IJ SUSP
80.0000 mg | Freq: Once | INTRAMUSCULAR | Status: AC
  Administered 2017-06-22: 80 mg via INTRA_ARTICULAR

## 2017-06-22 NOTE — Patient Instructions (Signed)
[facility] Call for problems

## 2017-06-22 NOTE — Progress Notes (Signed)
Time out Consent Posterior (    R    ) shoulder prepped and marked 1cc of 1% lidocaine wheal placed at injection site 3 cc of 1% lidocaine with 80 mg of DepoMedrol placed into subacromial space Patient tolerated procedure well Post injection instructions reviewed.  Patient is here requesting repeat injection only. He is not ready for referral to an orthopedic for consideration of surgery. No new trauma accident or injury.  BP 134/72 (BP Location: Right Arm, Patient Position: Sitting, Cuff Size: Large)   Pulse 80   Temp 98.4 F (36.9 C) (Oral)   Resp 18   Ht 6\' 1"  (1.854 m)   Wt 229 lb (103.9 kg)   SpO2 98%   BMI 30.21 kg/m

## 2017-07-30 ENCOUNTER — Encounter (HOSPITAL_COMMUNITY): Payer: Self-pay | Admitting: Emergency Medicine

## 2017-07-30 ENCOUNTER — Other Ambulatory Visit: Payer: Self-pay

## 2017-07-30 ENCOUNTER — Emergency Department (HOSPITAL_COMMUNITY)
Admission: EM | Admit: 2017-07-30 | Discharge: 2017-07-30 | Disposition: A | Discharge status: Home / Self Care | Payer: Self-pay | Attending: Emergency Medicine | Admitting: Emergency Medicine

## 2017-07-30 DIAGNOSIS — E119 Type 2 diabetes mellitus without complications: Secondary | ICD-10-CM | POA: Insufficient documentation

## 2017-07-30 DIAGNOSIS — J019 Acute sinusitis, unspecified: Secondary | ICD-10-CM | POA: Insufficient documentation

## 2017-07-30 DIAGNOSIS — I10 Essential (primary) hypertension: Secondary | ICD-10-CM | POA: Insufficient documentation

## 2017-07-30 DIAGNOSIS — Z87891 Personal history of nicotine dependence: Secondary | ICD-10-CM | POA: Insufficient documentation

## 2017-07-30 MED ORDER — FEXOFENADINE-PSEUDOEPHED ER 60-120 MG PO TB12: 1.0000 | ORAL_TABLET | Freq: Two times a day (BID) | ORAL | 0 refills | Status: DC

## 2017-07-30 MED ORDER — AMOXICILLIN-POT CLAVULANATE 875-125 MG PO TABS: 1.0000 | ORAL_TABLET | Freq: Two times a day (BID) | ORAL | 0 refills | Status: DC

## 2017-07-30 NOTE — ED Triage Notes (Signed)
Pt c/o head congestion x 1 week. Denies chest congestion or cough. States nasal mucus is clear. Nad.

## 2017-07-31 NOTE — ED Provider Notes (Signed)
[facility] EMERGENCY DEPARTMENT Provider Note   CSN: [id number] Arrival date & time: 07/30/17  0945     History   Chief Complaint Chief Complaint  Patient presents with  . Nasal Congestion    HPI Jon Washington is a 52 y.o. male.  HPI   52 year old male with facial pain/pressure.  Symptom onset 1.5 weeks ago.  Persistent since then.  Patient reports a lot of pain/pressure behind his eyes and nose.  Feels congested.  Has tried Afrin with some mild improvement.  No fevers.  Pain has been progressively worse in the point that he is having difficulty sleeping.  Past Medical History:  Diagnosis Date  . Chronic back pain   . HLD (hyperlipidemia) 06/13/2016  . Hypertension   . Sleep apnea     Patient Active Problem List   Diagnosis Date Noted  . Solar keratosis 06/30/2016  . Type 2 diabetes mellitus without complications (HCC) 06/13/2016  . HLD (hyperlipidemia) 06/13/2016  . Vitamin D deficiency 06/13/2016  . Family history of heart disease in male family member before age 18 06/12/2016  . Over weight 06/12/2016  . Elevated blood pressure reading 06/12/2016  . Encounter to establish care with new doctor 06/12/2016    Past Surgical History:  Procedure Laterality Date  . APPENDECTOMY  1974  . HERNIA REPAIR  1978        Home Medications    Prior to Admission medications   Medication Sig Start Date End Date Taking? Authorizing Provider  amoxicillin-clavulanate (AUGMENTIN) 875-125 MG tablet Take 1 tablet by mouth 2 (two) times daily. 07/30/17   Raeford Razor, MD  fexofenadine-pseudoephedrine (ALLEGRA-D) 60-120 MG 12 hr tablet Take 1 tablet by mouth every 12 (twelve) hours. 07/30/17   Raeford Razor, MD    Family History Family History  Problem Relation Age of Onset  . Thyroid disease Mother   . Alcohol abuse Father   . Arthritis Father   . Cancer Father        lymphoma  . COPD Father   . Heart disease Father        irreg beat  . Heart disease Sister 37         CAD  . Hypertension Sister   . Cancer Sister        hodgkins, breast, skin  . Breast cancer Sister   . Heart disease Maternal Uncle   . Mental illness Maternal Grandmother        social anxiety  . Heart disease Paternal Grandfather 55       heart attack  . SIDS Daughter     Social History Social History   Tobacco Use  . Smoking status: Former Smoker    Packs/day: 1.00    Types: Cigarettes    Start date: 05/01/1980    Last attempt to quit: 05/01/1997    Years since quitting: 20.2  . Smokeless tobacco: Never Used  Substance Use Topics  . Alcohol use: No  . Drug use: No     Allergies   Patient has no known allergies.   Review of Systems Review of Systems All systems reviewed and negative, other than as noted in HPI.   Physical Exam Updated Vital Signs BP 116/82 (BP Location: Left Arm)   Pulse 73   Temp 97.6 F (36.4 C) (Oral)   Resp 15   SpO2 98%   Physical Exam  Constitutional: He appears well-developed and well-nourished. No distress.  HENT:  Head: Normocephalic and atraumatic.  Tenderness to palpation.  Greater than noticed in the bilateral maxillary region.  No overlying skin changes.  Oropharynx is clear.  Neck supple.  Eyes: Conjunctivae are normal. Right eye exhibits no discharge. Left eye exhibits no discharge.  Neck: Neck supple.  Cardiovascular: Normal rate, regular rhythm and normal heart sounds. Exam reveals no gallop and no friction rub.  No murmur heard. Pulmonary/Chest: Effort normal and breath sounds normal. No respiratory distress.  Abdominal: Soft. He exhibits no distension. There is no tenderness.  Musculoskeletal: He exhibits no edema or tenderness.  Neurological: He is alert.  Skin: Skin is warm and dry.  Psychiatric: He has a normal mood and affect. His behavior is normal. Thought content normal.  Nursing note and vitals reviewed.    ED Treatments / Results  Labs (all labs ordered are listed, but only abnormal results are  displayed) Labs Reviewed - No data to display  EKG None  Radiology No results found.  Procedures Procedures (including critical care time)  Medications Ordered in ED Medications - No data to display   Initial Impression / Assessment and Plan / ED Course  I have reviewed the triage vital signs and the nursing notes.  Pertinent labs & imaging results that were available during my care of the patient were reviewed by me and considered in my medical decision making (see chart for details).     52 year old male with symptoms and exam consistent with sinusitis.  Given duration of symptoms for over the last week and a half, will give prescription for antibiotics.  Decongestants.  Advised saline rinses.  Return precautions discussed.  Outpatient follow-up otherwise.  Final Clinical Impressions(s) / ED Diagnoses   Final diagnoses:  Acute sinusitis, recurrence not specified, unspecified location    ED Discharge Orders        Ordered    amoxicillin-clavulanate (AUGMENTIN) 875-125 MG tablet  2 times daily     07/30/17 1222    fexofenadine-pseudoephedrine (ALLEGRA-D) 60-120 MG 12 hr tablet  Every 12 hours     07/30/17 1222       Raeford RazorKohut, Terralyn Matsumura, MD 07/31/17 1417

## 2017-08-20 IMAGING — DX DG CHEST 2V
2 series · 2 of 2 positions shown · non-contrast
Comparison: August 01, 2014

CLINICAL DATA: Hypertension

EXAM:
CHEST  2 VIEW

[chest lat]
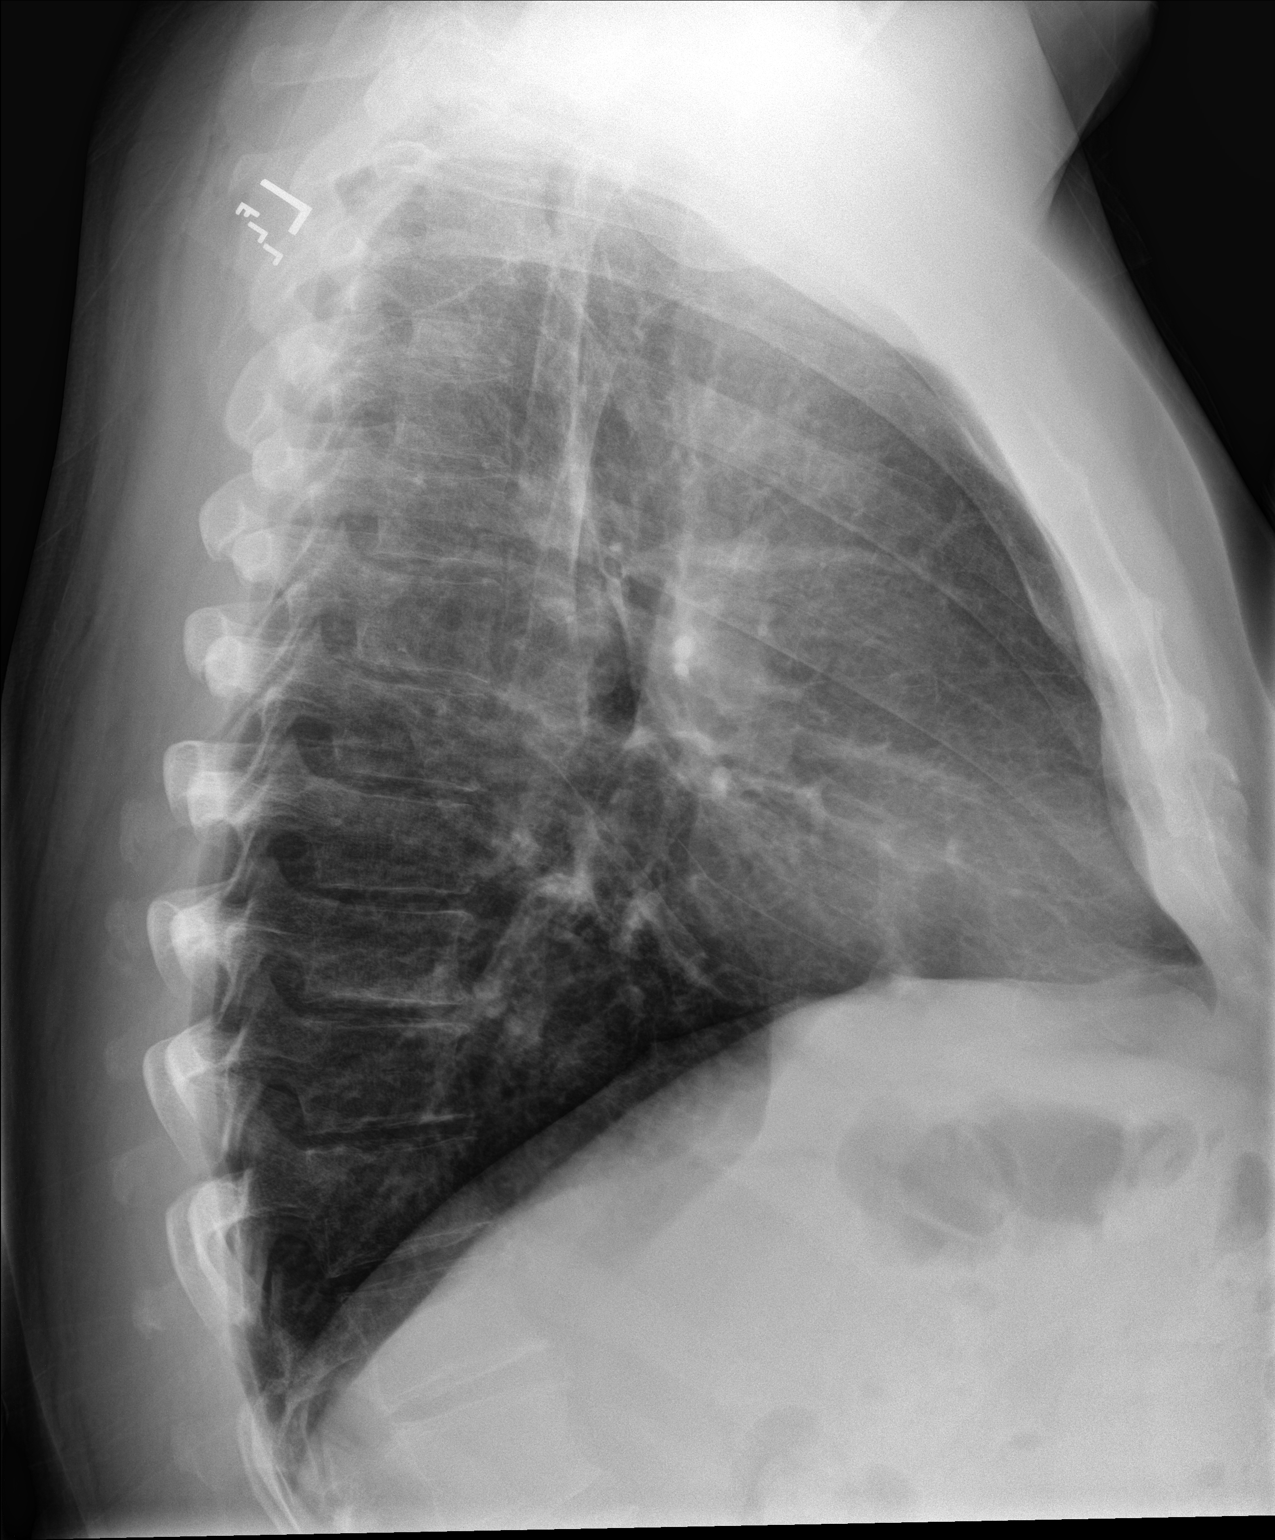

[chest pa]
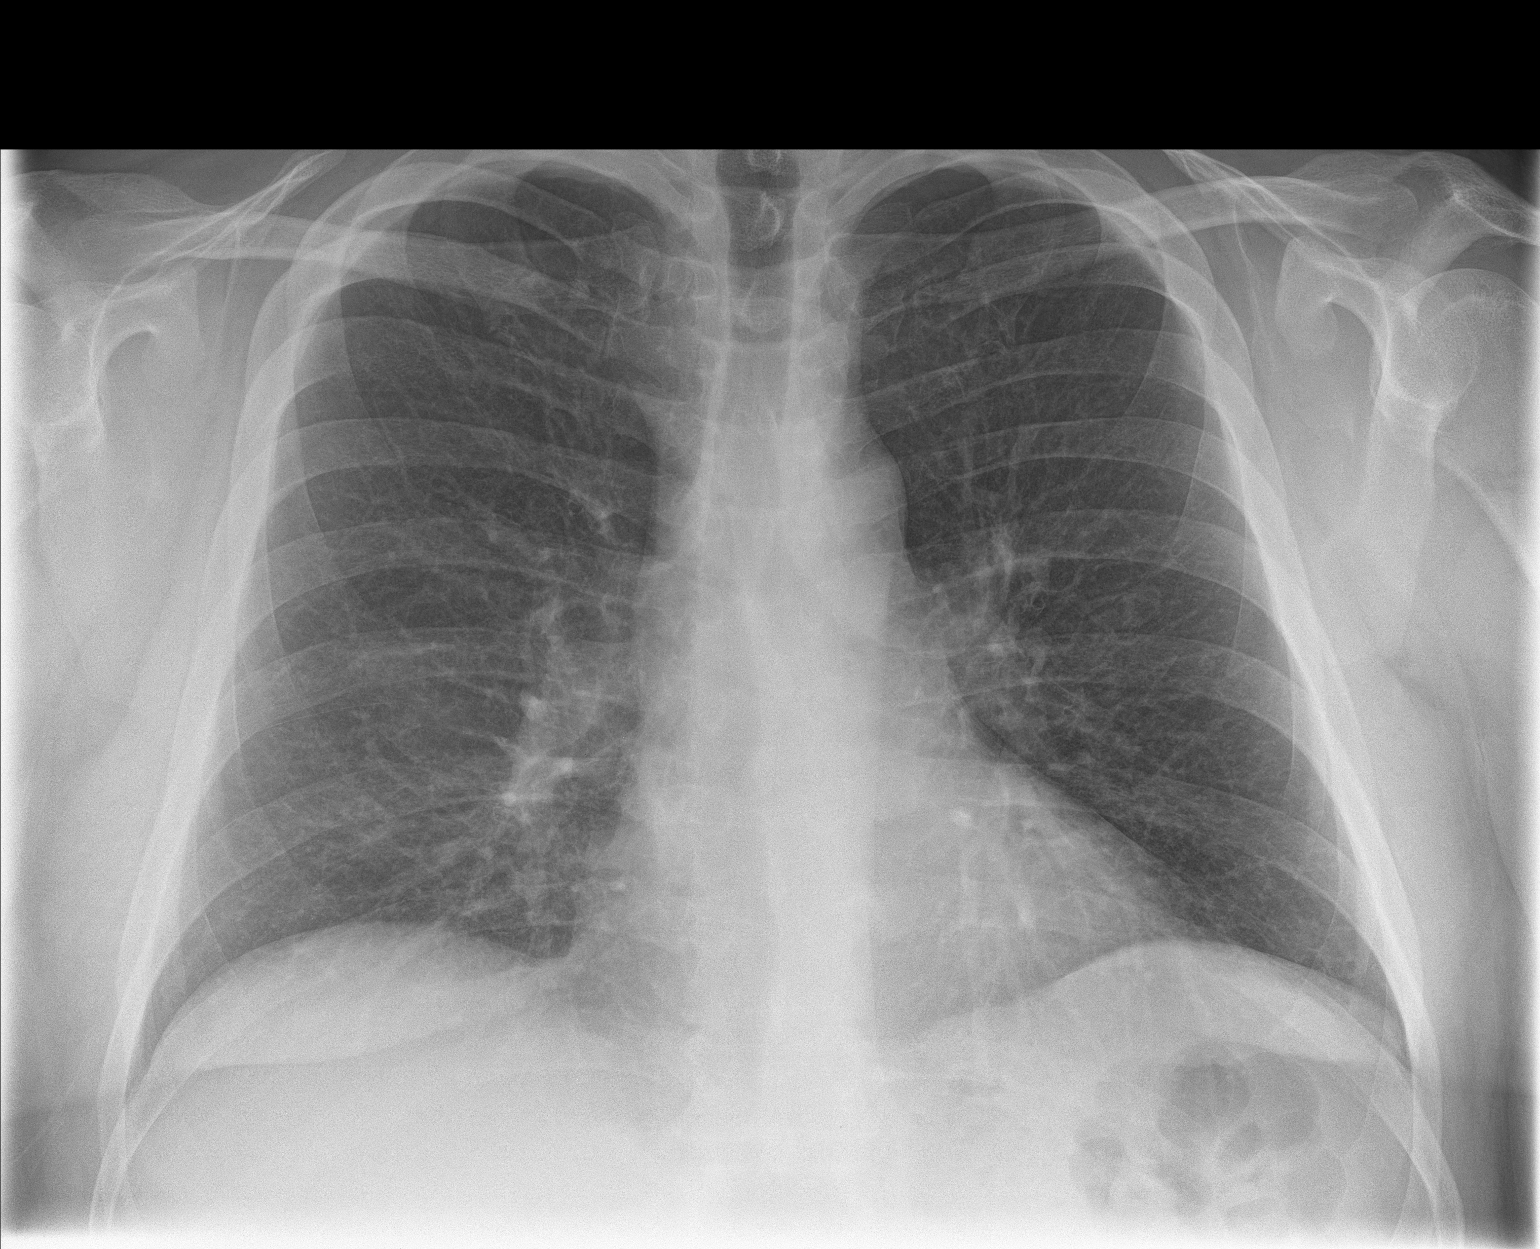

[2 of 2 positions shown; findings below may reference images not displayed]

FINDINGS: There is interstitial thickening bilaterally. There is no frank
edema or consolidation. Heart size and pulmonary vascularity are
normal. No adenopathy. No bone lesions.
IMPRESSION: Chronic interstitial thickening, likely due to chronic inflammatory
type change. No edema or consolidation. Stable cardiac silhouette.

## 2018-01-14 IMAGING — DX DG HAND COMPLETE 3+V*R*
3 series · 3 of 3 positions shown · non-contrast
Comparison: None.

CLINICAL DATA: Right third finger swelling and pain after injury
today.

EXAM:
RIGHT HAND - COMPLETE 3+ VIEW

[hand pa]
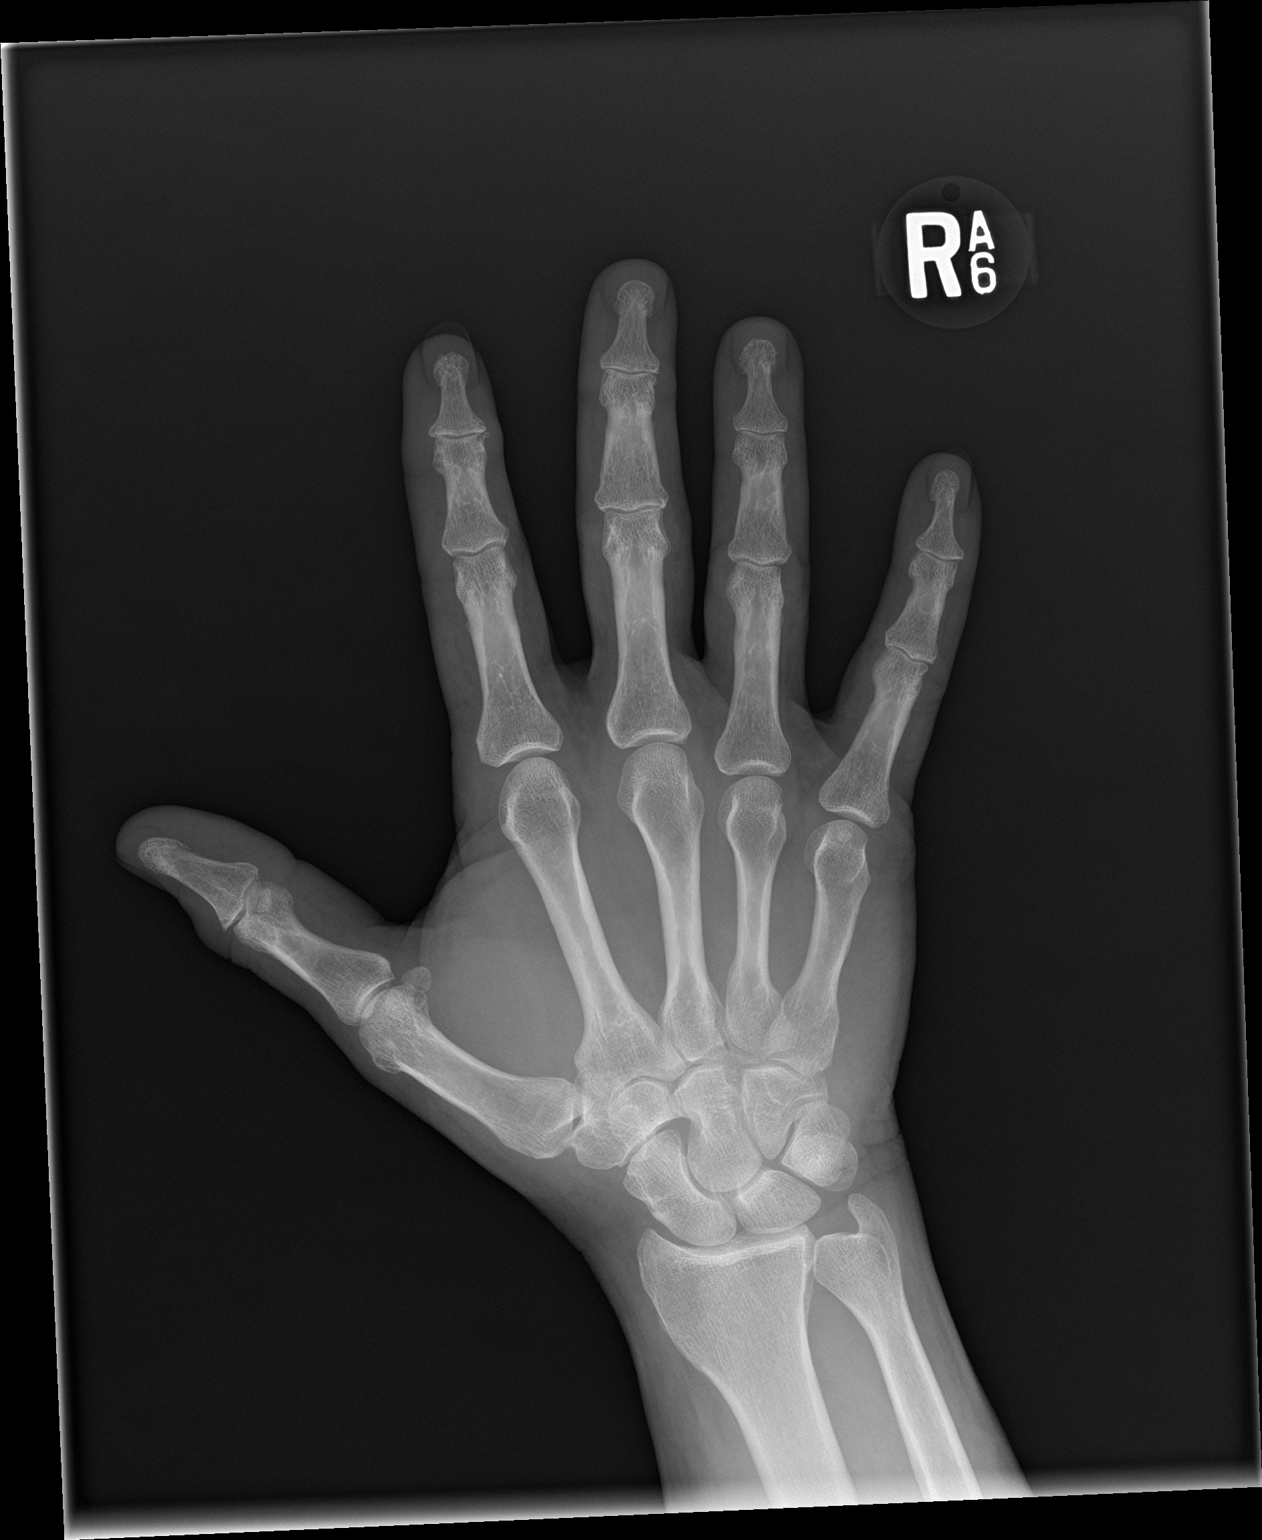

[hand obl]
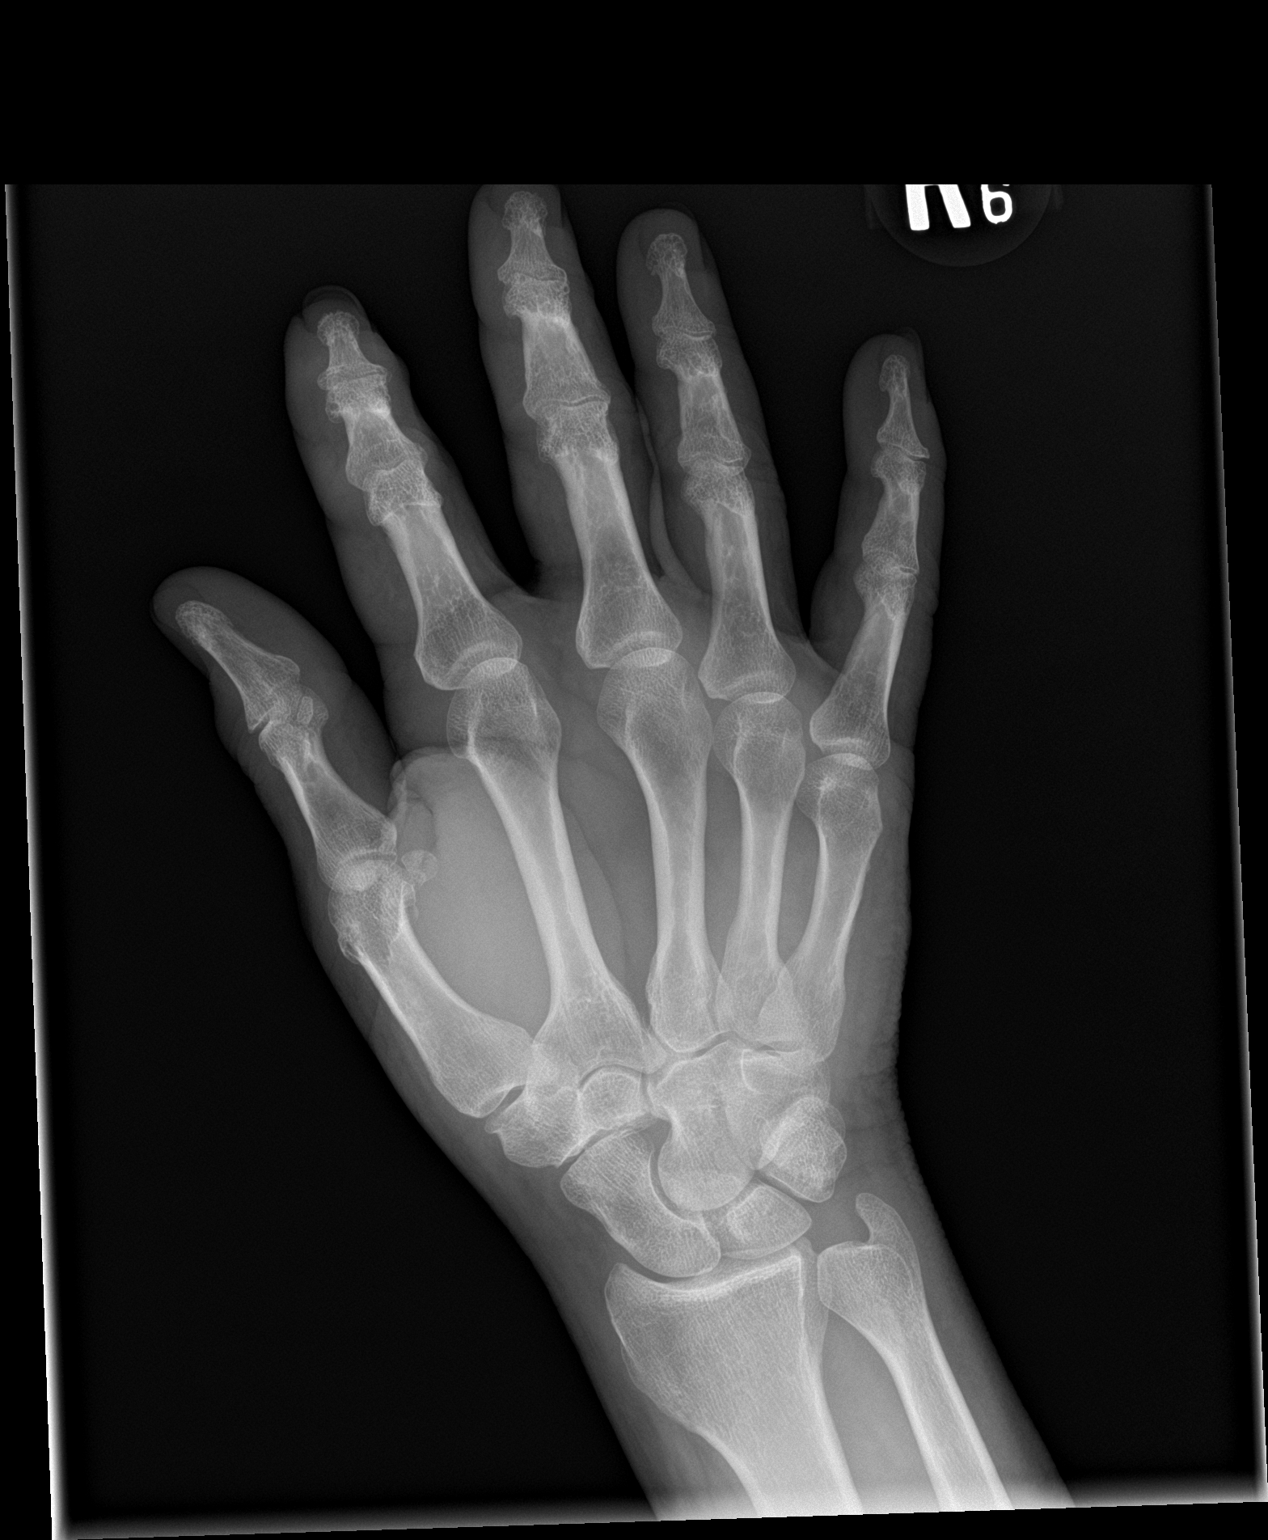

[hand lat]
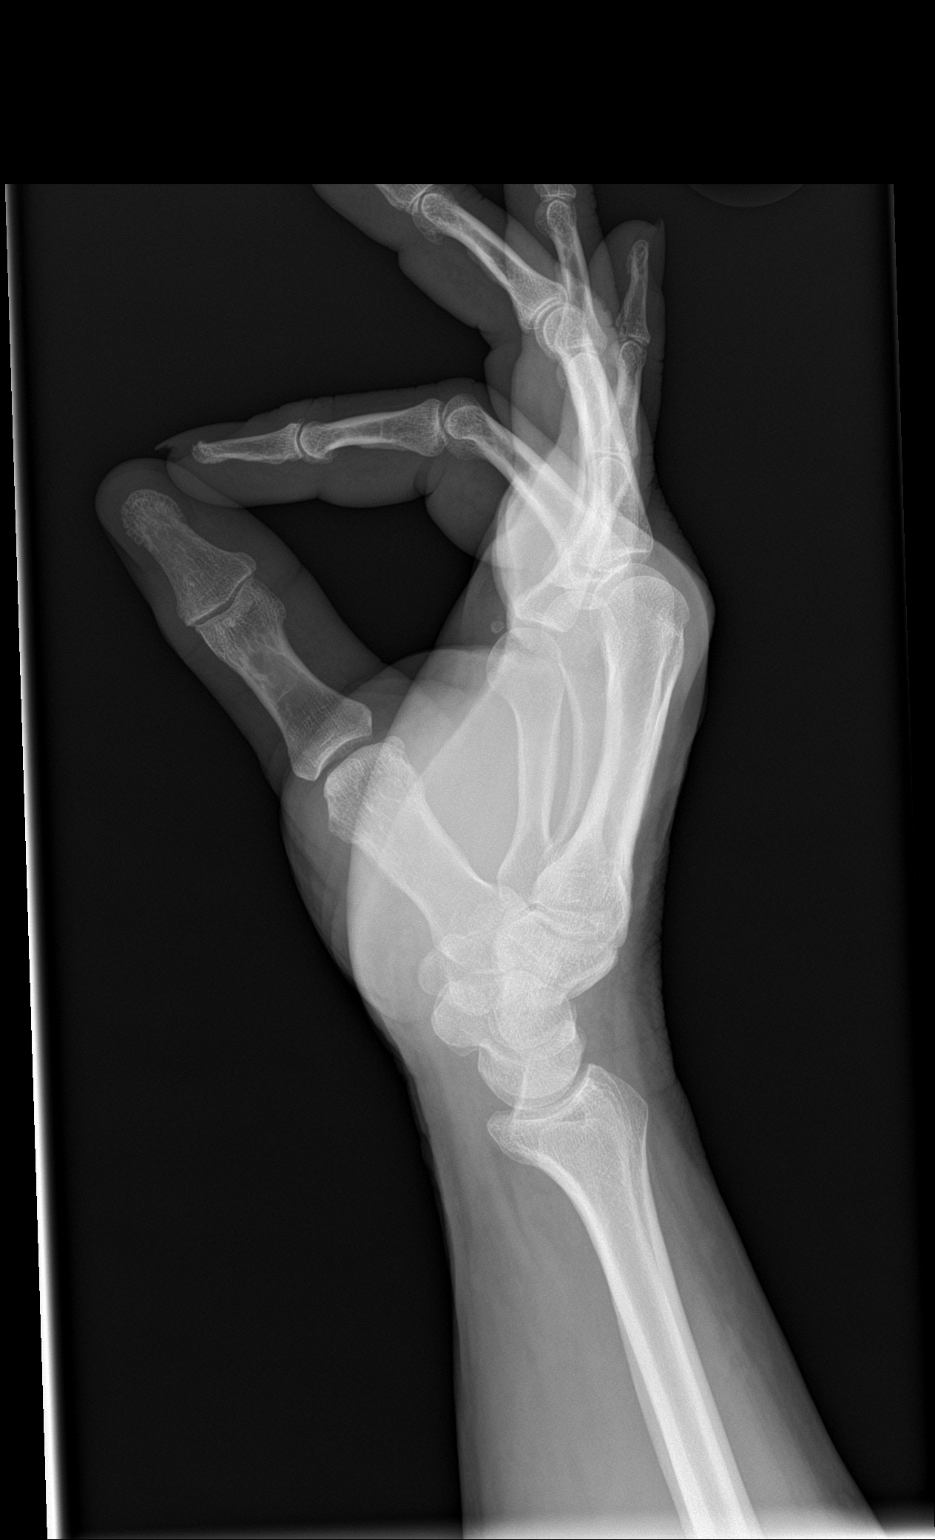

[3 of 3 positions shown; findings below may reference images not displayed]

FINDINGS: No fracture or dislocation. No suspicious focal osseous lesion. Mild
osteoarthritis in the distal interphalangeal joints of the second
and third right fingers and interphalangeal joint right thumb. No
radiopaque foreign body.
IMPRESSION: No fracture or malalignment.  Mild polyarticular osteoarthritis.

## 2019-02-25 ENCOUNTER — Other Ambulatory Visit: Payer: Self-pay | Admitting: *Deleted

## 2019-02-25 DIAGNOSIS — Z20822 Contact with and (suspected) exposure to covid-19: Secondary | ICD-10-CM

## 2019-02-26 LAB — NOVEL CORONAVIRUS, NAA: SARS-CoV-2, NAA: NOT DETECTED

## 2019-12-29 ENCOUNTER — Emergency Department (HOSPITAL_COMMUNITY)
Admission: EM | Admit: 2019-12-29 | Discharge: 2019-12-29 | Disposition: A | Discharge status: Home / Self Care | Payer: BC Managed Care – PPO | Attending: Emergency Medicine | Admitting: Emergency Medicine

## 2019-12-29 ENCOUNTER — Ambulatory Visit (INDEPENDENT_AMBULATORY_CARE_PROVIDER_SITE_OTHER): Payer: BC Managed Care – PPO | Admitting: Internal Medicine

## 2019-12-29 ENCOUNTER — Encounter (INDEPENDENT_AMBULATORY_CARE_PROVIDER_SITE_OTHER): Payer: Self-pay | Admitting: Internal Medicine

## 2019-12-29 ENCOUNTER — Encounter (HOSPITAL_COMMUNITY): Payer: Self-pay

## 2019-12-29 ENCOUNTER — Other Ambulatory Visit (INDEPENDENT_AMBULATORY_CARE_PROVIDER_SITE_OTHER): Payer: Self-pay | Admitting: Internal Medicine

## 2019-12-29 ENCOUNTER — Encounter (INDEPENDENT_AMBULATORY_CARE_PROVIDER_SITE_OTHER): Payer: Self-pay

## 2019-12-29 ENCOUNTER — Other Ambulatory Visit: Payer: Self-pay

## 2019-12-29 VITALS — BP 140/100 | HR 88 | Temp 97.7°F | Ht 73.0 in | Wt 246.8 lb

## 2019-12-29 DIAGNOSIS — M545 Low back pain, unspecified: Secondary | ICD-10-CM

## 2019-12-29 DIAGNOSIS — Z5321 Procedure and treatment not carried out due to patient leaving prior to being seen by health care provider: Secondary | ICD-10-CM | POA: Diagnosis not present

## 2019-12-29 LAB — URINALYSIS, ROUTINE W REFLEX MICROSCOPIC
Bacteria, UA: NONE SEEN
Bilirubin Urine: NEGATIVE
Glucose, UA: NEGATIVE mg/dL
Ketones, ur: NEGATIVE mg/dL
Leukocytes,Ua: NEGATIVE
Nitrite: NEGATIVE
Protein, ur: NEGATIVE mg/dL
Specific Gravity, Urine: 1.027 (ref 1.005–1.030)
pH: 5 (ref 5.0–8.0)

## 2019-12-29 MED ORDER — CYCLOBENZAPRINE HCL 5 MG PO TABS: 5.0000 mg | ORAL_TABLET | Freq: Three times a day (TID) | ORAL | 1 refills | Status: DC | PRN

## 2019-12-29 MED ORDER — DEXAMETHASONE SODIUM PHOSPHATE 4 MG/ML IJ SOLN
4.0000 mg | Freq: Once | INTRAMUSCULAR | Status: AC
  Administered 2019-12-29: 4 mg via INTRAMUSCULAR

## 2019-12-29 MED ORDER — PREDNISONE 20 MG PO TABS: 40.0000 mg | ORAL_TABLET | Freq: Every day | ORAL | 1 refills | Status: DC

## 2019-12-29 NOTE — ED Notes (Signed)
Tired of waiting per registration

## 2019-12-29 NOTE — ED Triage Notes (Signed)
Pt reports history of lower back pain that flares up intermittently.  Pt says his lower back started hurting yesterday. States pain doesn't radiate anywhere, no numbness or tingling in extremities.   Reports back felt tight and had some hesitancy when he tried to void yesterday.  Denies any urinary symptoms today.  Pt says he needs a note for work.

## 2019-12-29 NOTE — Progress Notes (Signed)
Metrics: Intervention Frequency ACO  Documented Smoking Status Yearly  Screened one or more times in 24 months  Cessation Counseling or  Active cessation medication Past 24 months  Past 24 months   Guideline developer: UpToDate (See UpToDate for funding source) Date Released: 2014       Wellness Office Visit  Subjective:  Patient ID: Jon Washington, male    DOB: 1966-04-28  Age: 54 y.o. MRN: [id number]  CC: Acute low back pain   HPI  This 54 year old patient comes to our practice as a new patient with an acute problem.  Yesterday morning, he started to get acute low back pain and rates it as a 7/10.  He has had back problems in the past and he does not know why it flared up yesterday.  Today, it is slightly better but still 5/10 pain scale. The pain does not radiate down his legs and he is able to walk without any urinary or bowel symptoms. He apparently had an old injury when he was in the [employer] previously. He has taken over-the-counter nonsteroidal anti-inflammatory medications with some relief. Past Medical History:  Diagnosis Date  . Chronic back pain   . HLD (hyperlipidemia) 06/13/2016  . Hypertension   . Sleep apnea    Past Surgical History:  Procedure Laterality Date  . APPENDECTOMY  1974  . HERNIA REPAIR  1978     Family History  Problem Relation Age of Onset  . Thyroid disease Mother   . Alcohol abuse Father   . Arthritis Father   . Cancer Father        lymphoma  . COPD Father   . Heart disease Father        irreg beat  . Heart disease Sister 26       CAD  . Hypertension Sister   . Cancer Sister        hodgkins, breast, skin  . Breast cancer Sister   . Heart disease Maternal Uncle   . Mental illness Maternal Grandmother        social anxiety  . Heart disease Paternal Grandfather 55       heart attack  . 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   Social History   Tobacco Use  . Smoking status: Former Smoker    Packs/day: 1.00    Types: Cigarettes    Start date: 05/01/1980    Quit date: 05/01/1997    Years since quitting: 22.6  . Smokeless tobacco: Never Used  Substance Use Topics  . Alcohol use: No    No outpatient medications have been marked as taking for the 12/29/19 encounter (Office Visit) with Wilson Singer, MD.      Depression screen [facility] 2/9 10/20/2016 06/12/2016  Decreased Interest 0 0  Down, Depressed, Hopeless 0 0  PHQ - 2 Score 0 0     Objective:   Today's Vitals: BP (!) 140/100 (BP Location: Left Arm, Patient Position: Sitting, Cuff Size: Normal)   Pulse 88   Temp 97.7 F (36.5 C) (Temporal)   Ht 6\' 1"  (1.854 m)   Wt 246 lb 12.8 oz (111.9 kg)   SpO2 99%   BMI 32.56 kg/m  Vitals with BMI 12/29/2019 12/29/2019 07/30/2017  Height 6\' 1"  6\' 1"  -  Weight 246 lbs 13 oz 260  lbs -  BMI 32.57 34.31 -  Systolic 140 133 620  Diastolic 100 73 82  Pulse 88 82 73     Physical Exam  He appears uncomfortable on movement.  His blood pressure is elevated, likely as a result of his pain.  He does appear to have acute muscle spasm palpated in the lower back around the region of L4/L5 in the right paramedian area.  There is no bony spinal tenderness.  No focal neurological signs.     Assessment   1. Acute midline low back pain without sciatica       Tests ordered No orders of the defined types were placed in this encounter.    Plan: 1. He will be given dexamethasone 4 mg intramuscular in the office today and I have sent a prescription for prednisone which he can start tomorrow morning and is well aware of side effects.  Also, I will give him cyclobenzaprine as a muscle relaxant. 2. I also instructed him in some simple back stretching exercises. 3. If he does not improve, he will let me know and in the meantime I will see him in October for an annual physical exam.   Meds ordered  this encounter  Medications  . cyclobenzaprine (FLEXERIL) 5 MG tablet    Sig: Take 1 tablet (5 mg total) by mouth 3 (three) times daily as needed for muscle spasms.    Dispense:  30 tablet    Refill:  1  . predniSONE (DELTASONE) 20 MG tablet    Sig: Take 2 tablets (40 mg total) by mouth daily with breakfast.    Dispense:  10 tablet    Refill:  1    [name], MD

## 2019-12-29 NOTE — Addendum Note (Signed)
Addended by: Jani Gravel A on: 12/29/2019 03:18 PM   Modules accepted: Orders

## 2020-02-18 ENCOUNTER — Ambulatory Visit (INDEPENDENT_AMBULATORY_CARE_PROVIDER_SITE_OTHER): Payer: BC Managed Care – PPO | Admitting: Internal Medicine

## 2020-05-27 ENCOUNTER — Ambulatory Visit (INDEPENDENT_AMBULATORY_CARE_PROVIDER_SITE_OTHER): Payer: BC Managed Care – PPO | Admitting: Internal Medicine

## 2020-05-27 ENCOUNTER — Encounter (INDEPENDENT_AMBULATORY_CARE_PROVIDER_SITE_OTHER): Payer: Self-pay | Admitting: Internal Medicine

## 2020-05-27 ENCOUNTER — Other Ambulatory Visit: Payer: Self-pay

## 2020-05-27 VITALS — BP 138/78 | HR 85 | Temp 97.7°F | Ht 73.0 in | Wt 250.4 lb

## 2020-05-27 DIAGNOSIS — E119 Type 2 diabetes mellitus without complications: Secondary | ICD-10-CM

## 2020-05-27 DIAGNOSIS — E785 Hyperlipidemia, unspecified: Secondary | ICD-10-CM | POA: Diagnosis not present

## 2020-05-27 DIAGNOSIS — E559 Vitamin D deficiency, unspecified: Secondary | ICD-10-CM | POA: Diagnosis not present

## 2020-05-27 DIAGNOSIS — Z125 Encounter for screening for malignant neoplasm of prostate: Secondary | ICD-10-CM

## 2020-05-27 NOTE — Progress Notes (Signed)
Metrics: Intervention Frequency ACO  Documented Smoking Status Yearly  Screened one or more times in 24 months  Cessation Counseling or  Active cessation medication Past 24 months  Past 24 months   Guideline developer: UpToDate (See UpToDate for funding source) Date Released: 2014       Wellness Office Visit  Subjective:  Patient ID: Jon Washington, male    DOB: 10-07-1965  Age: 55 y.o. MRN: [id number]  CC: This man comes in for follow-up after long hiatus.  He had 1 no-show on the previous time.  I seen him in August for back pain which seems to resolve.  HPI  He has a history of diabetes, dyslipidemia and vitamin D deficiency.  These have not been checked since he has been a patient at this practice because he has only had one visit.  He says he feels well and has no concerns.  He was actually not aware that he was previously diagnosed with diabetes. He is on no medications. Past Medical History:  Diagnosis Date  . Chronic back pain   . HLD (hyperlipidemia) 06/13/2016  . Hypertension   . Sleep apnea    Past Surgical History:  Procedure Laterality Date  . APPENDECTOMY  1974  . HERNIA REPAIR  1978     Family History  Problem Relation Age of Onset  . Thyroid disease Mother   . Alcohol abuse Father   . Arthritis Father   . Cancer Father        lymphoma  . COPD Father   . Heart disease Father        irreg beat  . Heart disease Sister 28       CAD  . Hypertension Sister   . Cancer Sister        hodgkins, breast, skin  . Breast cancer Sister   . Heart disease Maternal Uncle   . Mental illness Maternal Grandmother        social anxiety  . Heart disease Paternal Grandfather 55       heart attack  . 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   Social History   Tobacco Use  . Smoking status: Former Smoker    Packs/day: 1.00    Types:  Cigarettes    Start date: 05/01/1980    Quit date: 05/01/1997    Years since quitting: 23.0  . Smokeless tobacco: Never Used  Substance Use Topics  . Alcohol use: No    No outpatient medications have been marked as taking for the 05/27/20 encounter (Office Visit) with Wilson Singer, MD.      Depression screen [facility] 2/9 05/27/2020 10/20/2016 06/12/2016  Decreased Interest 0 0 0  Down, Depressed, Hopeless 0 0 0  PHQ - 2 Score 0 0 0  Altered sleeping 0 - -  Tired, decreased energy 0 - -  Change in appetite 0 - -  Feeling bad or failure about yourself  0 - -  Trouble concentrating 0 - -  Moving slowly or fidgety/restless 0 - -  Suicidal thoughts 0 - -  PHQ-9 Score 0 - -  Difficult doing work/chores Not difficult at all - -     Objective:   Today's Vitals: BP 138/78   Pulse 85   Temp 97.7 F (36.5 C) (Temporal)   Ht 6'  1" (1.854 m)   Wt 250 lb 6.4 oz (113.6 kg)   SpO2 96%   BMI 33.04 kg/m  Vitals with BMI 05/27/2020 12/29/2019 12/29/2019  Height 6\' 1"  6\' 1"  6\' 1"   Weight 250 lbs 6 oz 246 lbs 13 oz 260 lbs  BMI 33.04 32.57 34.31  Systolic 138 140  Diastolic 78 100 73  Pulse 85 88 82     Physical Exam   He is obese.  Blood pressure borderline elevated.  No other physical findings.    Assessment   1. Diabetes mellitus without complication (HCC)   2. Dyslipidemia   3. Vitamin D deficiency disease   4. Special screening for malignant neoplasm of prostate       Tests ordered Orders Placed This Encounter  Procedures  . COMPLETE METABOLIC PANEL WITH GFR  . Lipid panel  . Hemoglobin A1c  . VITAMIN D 25 Hydroxy (Vit-D Deficiency, Fractures)  . PSA, Total with Reflex to PSA, Free     Plan: 1. We will check blood work today for his diabetes, dyslipidemia, vitamin D levels and also will add PSA as this does not seem to have been done. 2. Further recommendations will depend on blood results and I will have him follow-up with Sarah in 3 months.   No orders of  the defined types were placed in this encounter.   , MD

## 2020-05-28 LAB — LIPID PANEL
Cholesterol: 212 mg/dL — ABNORMAL HIGH (ref <200)
HDL: 39 mg/dL — ABNORMAL LOW (ref >=40)
LDL Cholesterol (Calc): 133 mg/dL (calc) — ABNORMAL HIGH
Non-HDL Cholesterol (Calc): 173 mg/dL (calc) — ABNORMAL HIGH (ref <130)
Total CHOL/HDL Ratio: 5.4 (calc) — ABNORMAL HIGH (ref <5.0)
Triglycerides: 258 mg/dL — ABNORMAL HIGH (ref <150)

## 2020-05-28 LAB — COMPLETE METABOLIC PANEL WITH GFR
AG Ratio: 1.8 (calc) (ref 1.0–2.5)
ALT: 10 U/L (ref 9–46)
AST: 12 U/L (ref 10–35)
Albumin: 4.2 g/dL (ref 3.6–5.1)
Alkaline phosphatase (APISO): 67 U/L (ref 35–144)
BUN: 16 mg/dL (ref 7–25)
CO2: 25 mmol/L (ref 20–32)
Calcium: 9.1 mg/dL (ref 8.6–10.3)
Chloride: 107 mmol/L (ref 98–110)
Creat: 0.92 mg/dL (ref 0.70–1.33)
GFR, Est African American: 109 mL/min/{1.73_m2} (ref >=60)
GFR, Est Non African American: 94 mL/min/{1.73_m2} (ref >=60)
Globulin: 2.3 g/dL (calc) (ref 1.9–3.7)
Glucose, Bld: 106 mg/dL (ref 65–139)
Potassium: 4.2 mmol/L (ref 3.5–5.3)
Sodium: 140 mmol/L (ref 135–146)
Total Bilirubin: 0.3 mg/dL (ref 0.2–1.2)
Total Protein: 6.5 g/dL (ref 6.1–8.1)

## 2020-05-28 LAB — PSA, TOTAL WITH REFLEX TO PSA, FREE: PSA, Total: 0.4 ng/mL (ref <=4.0)

## 2020-05-28 LAB — HEMOGLOBIN A1C
Hgb A1c MFr Bld: 6.8 % of total Hgb — ABNORMAL HIGH (ref <5.7)
Mean Plasma Glucose: 148 mg/dL
eAG (mmol/L): 8.2 mmol/L

## 2020-05-28 LAB — VITAMIN D 25 HYDROXY (VIT D DEFICIENCY, FRACTURES): Vit D, 25-Hydroxy: 13 ng/mL — ABNORMAL LOW (ref 30–100)

## 2020-06-14 ENCOUNTER — Other Ambulatory Visit: Payer: Self-pay

## 2020-06-14 ENCOUNTER — Ambulatory Visit (INDEPENDENT_AMBULATORY_CARE_PROVIDER_SITE_OTHER): Payer: 59 | Admitting: Internal Medicine

## 2020-06-14 ENCOUNTER — Encounter (INDEPENDENT_AMBULATORY_CARE_PROVIDER_SITE_OTHER): Payer: Self-pay | Admitting: Internal Medicine

## 2020-06-14 VITALS — BP 160/80 | HR 80 | Ht 73.0 in | Wt 254.4 lb

## 2020-06-14 DIAGNOSIS — G8929 Other chronic pain: Secondary | ICD-10-CM

## 2020-06-14 DIAGNOSIS — M25511 Pain in right shoulder: Secondary | ICD-10-CM

## 2020-06-14 NOTE — Progress Notes (Signed)
Metrics: Intervention Frequency ACO  Documented Smoking Status Yearly  Screened one or more times in 24 months  Cessation Counseling or  Active cessation medication Past 24 months  Past 24 months   Guideline developer: UpToDate (See UpToDate for funding source) Date Released: 2014       Wellness Office Visit  Subjective:  Patient ID: Jon Washington, male    DOB: December 08, 1965  Age: 55 y.o. MRN: [id number]  CC: Right shoulder pain. HPI  This patient had an injury 17 years ago to the right shoulder and apparently had rotator cuff tear.  From his description, it appears that he has dislocated the shoulder at least 2-3 times.  He apparently was recommended to have surgery but for one reason or another did not.  Now he feels the the right shoulder pain is bothering him enough that he wants something done about it.  He is currently not in acute pain. Past Medical History:  Diagnosis Date  . Chronic back pain   . HLD (hyperlipidemia) 06/13/2016  . Hypertension   . Sleep apnea    Past Surgical History:  Procedure Laterality Date  . APPENDECTOMY  1974  . HERNIA REPAIR  1978     Family History  Problem Relation Age of Onset  . Thyroid disease Mother   . Alcohol abuse Father   . Arthritis Father   . Cancer Father        lymphoma  . COPD Father   . Heart disease Father        irreg beat  . Heart disease Sister 1       CAD  . Hypertension Sister   . Cancer Sister        hodgkins, breast, skin  . Breast cancer Sister   . Heart disease Maternal Uncle   . Mental illness Maternal Grandmother        social anxiety  . Heart disease Paternal Grandfather 55       heart attack  . 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   Social History   Tobacco Use  . Smoking status: Former Smoker    Packs/day: 1.00    Types: Cigarettes    Start date: 05/01/1980    Quit  date: 05/01/1997    Years since quitting: 23.1  . Smokeless tobacco: Never Used  Substance Use Topics  . Alcohol use: No    No outpatient medications have been marked as taking for the 06/14/20 encounter (Office Visit) with Wilson Singer, MD.     Flowsheet Row Office Visit from 05/27/2020 in [facility]  PHQ-9 Total Score 0      Objective:   Today's Vitals: BP (!) 160/80   Pulse 80   Ht 6\' 1"  (1.854 m)   Wt 254 lb 6.4 oz (115.4 kg)   BMI 33.56 kg/m  Vitals with BMI 06/14/2020 05/27/2020 12/29/2019  Height 6\' 1"  6\' 1"  6\' 1"   Weight 254 lbs 6 oz 250 lbs 6 oz 246 lbs 13 oz  BMI 33.57 33.04 32.57  Systolic 160 138 12/31/2019  Diastolic 80 78 100  Pulse 80 85 88     Physical Exam  He looks systemically well, remains obese.  Systolic blood pressure elevated today but he has been rushing to get to the appointment.  Assessment   1. Chronic right shoulder pain       Tests ordered Orders Placed This Encounter  Procedures  . Ambulatory referral to Orthopedic Surgery     Plan: 1. This is a chronic injury and I will refer to the orthopedic surgeon for further evaluation.  No doubt, he will likely need a repeat MRI shoulder to see where things are now.   No orders of the defined types were placed in this encounter.   Wilson Singer, MD

## 2020-06-18 ENCOUNTER — Ambulatory Visit (INDEPENDENT_AMBULATORY_CARE_PROVIDER_SITE_OTHER): Payer: 59 | Admitting: Orthopedic Surgery

## 2020-06-18 ENCOUNTER — Encounter: Payer: Self-pay | Admitting: Orthopedic Surgery

## 2020-06-18 ENCOUNTER — Ambulatory Visit: Payer: BC Managed Care – PPO

## 2020-06-18 ENCOUNTER — Other Ambulatory Visit: Payer: Self-pay

## 2020-06-18 VITALS — BP 141/107 | HR 93 | Ht 73.0 in | Wt 254.0 lb

## 2020-06-18 DIAGNOSIS — M19011 Primary osteoarthritis, right shoulder: Secondary | ICD-10-CM

## 2020-06-18 DIAGNOSIS — G8929 Other chronic pain: Secondary | ICD-10-CM

## 2020-06-18 NOTE — Progress Notes (Signed)
New Patient Visit  Assessment: Jon Washington is a 55 y.o. male with the following: Right shoulder pain; history of rotator cuff tear Mild-   Plan: Mr. Ostrow has right shoulder pain which limits his activities occasionally.  He was reportedly told over 15 years ago, and he has a large rotator cuff tear.  However, on physical exam, he has excellent range of motion, good strength and some pain elicited on provocative maneuvers.  Due to the high level of his function currently, I would recommend against further intervention at this time.  He can continue with Tylenol and ibuprofen as needed.  I have advised him to remain as active as possible.  If his pain worsens, and he is interested in another steroid injection, he can contact the clinic to schedule follow-up appointment.  At the same time, we may discuss obtaining an MRI for further evaluation of his right shoulder.  I discussed with him that the radiographs demonstrate some signs of arthritis, as well as some mild proximal humeral migration, which is consistent with a large rotator cuff tear.  All questions were answered and he is in agreement with this plan.  Nothing further is needed at this time.  Follow-up as needed.  Follow-up: Return if symptoms worsen or fail to improve.  Subjective:  Chief Complaint  Patient presents with  . Shoulder Pain    Right, Patient reports that he was playing softball in 2005 and he was told he may have a tear in his shoulder,      History of Present Illness: Jon Washington is a 55 y.o. RHD male who has been referred to clinic today by Lilly Cove, MD for evaluation of right shoulder pain.  He states that he injured his shoulder in 2005, while playing softball.  He had a shoulder dislocation which spontaneously reduced.  He had an MRI and follow-up to that injury, and was told he had a large rotator cuff tear, and surgery was recommended.  However, he did not undergo surgery.  His pain  ultimately improved.  He regained his function.  Occasionally, he will have pain, primarily in the superior and anterior aspect of his right shoulder.  This pain can be worsened with increased activity.  He will take over-the-counter pain medications once in a while, with some improvement in his symptoms.  He has previously had injections, with excellent relief.  His last injection was over a year ago.  He has not participated in physical therapy, but as a pipefitter, he remains very active.  He notes some difficulty with strenuous activities.  He does notice that his pain worsens at night.  He cannot sleep on his right side.  He states his wife would note when he has pain in his right shoulder, and he decided to pursue another evaluation for peace of mind.   Review of Systems: No fevers or chills No numbness or tingling No chest pain No shortness of breath No bowel or bladder dysfunction No GI distress No headaches   Medical History:  Past Medical History:  Diagnosis Date  . Chronic back pain   . HLD (hyperlipidemia) 06/13/2016  . Hypertension   . Sleep apnea     Past Surgical History:  Procedure Laterality Date  . APPENDECTOMY  1974  . HERNIA REPAIR  1978    Family History  Problem Relation Age of Onset  . Thyroid disease Mother   . Alcohol abuse Father   . Arthritis Father   . Cancer Father  lymphoma  . COPD Father   . Heart disease Father        irreg beat  . Heart disease Sister 58       CAD  . Hypertension Sister   . Cancer Sister        hodgkins, breast, skin  . Breast cancer Sister   . Heart disease Maternal Uncle   . Mental illness Maternal Grandmother        social anxiety  . Heart disease Paternal Grandfather 55       heart attack  . SIDS Daughter    Social History   Tobacco Use  . Smoking status: Former Smoker    Packs/day: 1.00    Types: Cigarettes    Start date: 05/01/1980    Quit date: 05/01/1997    Years since quitting: 23.1  . Smokeless  tobacco: Never Used  Vaping Use  . Vaping Use: Former  Substance Use Topics  . Alcohol use: No  . Drug use: No    No Known Allergies  No outpatient medications have been marked as taking for the 06/18/20 encounter (Office Visit) with Oliver Barre, MD.    Objective: BP (!) 141/107   Pulse 93   Ht 6\' 1"  (1.854 m)   Wt 254 lb (115.2 kg) Comment: in chart  BMI 33.51 kg/m   Physical Exam:  General: Alert and oriented, no acute distress Gait: Normal  Evaluation of the right shoulder demonstrates no deformity.  No atrophy is appreciated.  He has full and symmetric range of motion his bilateral shoulders.  Strength testing of the deltoids, supraspinatus, infraspinatus, subscapularis and biceps is all 5/5 bilaterally.  Sensation is intact.  Negative belly press.  No pain with empty can testing position.  Positive O'Brien's. Positive speeds in the supinated position.  Tenderness palpation over the anterior shoulder, in line with the long head of the biceps tendon.  IMAGING: I personally ordered and reviewed the following images  X-ray of the right shoulder demonstrates no dislocation.  There is no acute injury noted.  He has loss of the glenohumeral joint space, with a small inferior osteophyte on the humeral head.  Mild proximal humeral migration.  Impression: Right shoulder with mild-moderate degenerative changes and evidence of a chronic rotator cuff injury.  New Medications:  No orders of the defined types were placed in this encounter.     , MD  06/18/2020 4:23 PM

## 2020-08-25 ENCOUNTER — Ambulatory Visit (INDEPENDENT_AMBULATORY_CARE_PROVIDER_SITE_OTHER): Payer: 59 | Admitting: Nurse Practitioner

## 2020-08-25 ENCOUNTER — Other Ambulatory Visit: Payer: Self-pay

## 2020-08-25 ENCOUNTER — Encounter (INDEPENDENT_AMBULATORY_CARE_PROVIDER_SITE_OTHER): Payer: Self-pay | Admitting: Nurse Practitioner

## 2020-08-25 ENCOUNTER — Other Ambulatory Visit (INDEPENDENT_AMBULATORY_CARE_PROVIDER_SITE_OTHER): Payer: Self-pay

## 2020-08-25 VITALS — BP 138/76 | HR 88 | Temp 97.9°F | Resp 18 | Ht 73.0 in | Wt 248.0 lb

## 2020-08-25 DIAGNOSIS — Z0000 Encounter for general adult medical examination without abnormal findings: Secondary | ICD-10-CM

## 2020-08-25 DIAGNOSIS — L989 Disorder of the skin and subcutaneous tissue, unspecified: Secondary | ICD-10-CM | POA: Diagnosis not present

## 2020-08-25 DIAGNOSIS — Z1211 Encounter for screening for malignant neoplasm of colon: Secondary | ICD-10-CM

## 2020-08-25 DIAGNOSIS — R0681 Apnea, not elsewhere classified: Secondary | ICD-10-CM | POA: Diagnosis not present

## 2020-08-25 DIAGNOSIS — Z23 Encounter for immunization: Secondary | ICD-10-CM

## 2020-08-25 DIAGNOSIS — E559 Vitamin D deficiency, unspecified: Secondary | ICD-10-CM

## 2020-08-25 DIAGNOSIS — R0683 Snoring: Secondary | ICD-10-CM

## 2020-08-25 DIAGNOSIS — E785 Hyperlipidemia, unspecified: Secondary | ICD-10-CM

## 2020-08-25 DIAGNOSIS — R5383 Other fatigue: Secondary | ICD-10-CM | POA: Diagnosis not present

## 2020-08-25 DIAGNOSIS — E119 Type 2 diabetes mellitus without complications: Secondary | ICD-10-CM

## 2020-08-25 NOTE — Progress Notes (Signed)
Subjective:  Patient ID: Jon Washington, male    DOB: [birth year]  Age: 55 y.o. MRN: [id number]  CC:  Chief Complaint  Patient presents with  . Follow-up    Wife gave him a list of things to be done.  Sleep study, skin cancer concerns, eye exam  . Referral    Dermatology, sleep apnea/sleep study.  . Diabetes  . Hyperlipidemia  . Other    Vitamin D deficiency, apneic episodes, skin lesions, health maintenance      HPI  This patient arrives today for the above.  Diabetes: Last A1c was collected exactly 3 months ago and was 6.8.  He is not currently on any medication for treatment of this.  He has had an A1c in the diabetic range in the past, but with lifestyle changes was able to get this down to the prediabetic range.  He tells me since his last office visit he has made changes to his lifestyle and is trying to get his A1c down without the use of medication if possible.  Hyperlipidemia: 3 months ago lipid panel was collected which did show an LDL of 133.  ASCVD is approximately 14.5%.  As stated above he is a diabetic.  He is due to discuss benefits versus risk of statin therapy today.  Vitamin D deficiency: 3 months ago vitamin D level was checked and it was 13.  He is not currently on a vitamin D3 supplement.  Apneic episodes: He tells me that he does snore especially when he lays on his back and that his wife has witnessed apneic episodes at night he is also woken up gasping for breath at night and would like to be evaluated for sleep apnea.  Skin lesions: He has multiple lesions to his skin on his scalp, back, and left leg.  He is concerned about skin cancer as he has a family history of skin cancer.  He would like to be referred to dermatology.  Health maintenance: He is due for multiple screenings including colon cancer screening and tetanus shot.  Past Medical History:  Diagnosis Date  . Chronic back pain   . HLD (hyperlipidemia) 06/13/2016  . Hypertension   .  Sleep apnea       Family History  Problem Relation Age of Onset  . Thyroid disease Mother   . Alcohol abuse Father   . Arthritis Father   . Cancer Father        lymphoma  . COPD Father   . Heart disease Father        irreg beat  . Heart disease Sister 87       CAD  . Hypertension Sister   . Cancer Sister        hodgkins, breast, skin  . Breast cancer Sister   . Heart disease Maternal Uncle   . Mental illness Maternal Grandmother        social anxiety  . Heart disease Paternal Grandfather 55       heart attack  . 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   Social History   Tobacco Use  . Smoking status: Former Smoker    Packs/day: 1.00    Types: Cigarettes    Start date: 05/01/1980    Quit date: 05/01/1997    Years since quitting:  23.3  . Smokeless tobacco: Never Used  Substance Use Topics  . Alcohol use: No     Current Meds  Medication Sig  . Cholecalciferol 50 MCG (2000 UT) CHEW Chew 2,000 Int'l Units by mouth daily.    ROS:  Review of Systems  Constitutional: Negative for malaise/fatigue.  Respiratory: Negative for shortness of breath.   Cardiovascular: Negative for chest pain.  Neurological: Negative for dizziness and headaches.     Objective:   Today's Vitals: BP 138/76 (BP Location: Right Arm, Patient Position: Sitting, Cuff Size: Normal)   Pulse 88   Temp 97.9 F (36.6 C) (Temporal)   Resp 18   Ht 6\' 1"  (1.854 m)   Wt 248 lb (112.5 kg)   SpO2 99%   BMI 32.72 kg/m  Vitals with BMI 08/25/2020 06/18/2020 06/14/2020  Height 6\' 1"  6\' 1"  6\' 1"   Weight 248 lbs 254 lbs 254 lbs 6 oz  BMI 32.73 33.52 33.57  Systolic 138 141 06/16/2020  Diastolic 76 107 80  Pulse 88 93 80     Physical Exam Vitals reviewed.  Constitutional:      Appearance: Normal appearance.  HENT:     Head: Normocephalic and atraumatic.  Cardiovascular:     Rate  and Rhythm: Normal rate and regular rhythm.  Pulmonary:     Effort: Pulmonary effort is normal.     Breath sounds: Normal breath sounds.  Musculoskeletal:     Cervical back: Neck supple.  Skin:    General: Skin is warm and dry.  Neurological:     Mental Status: He is alert and oriented to person, place, and time.  Psychiatric:        Mood and Affect: Mood normal.        Behavior: Behavior normal.        Thought Content: Thought content normal.        Judgment: Judgment normal.          Assessment and Plan   1. Snoring   2. Witnessed episode of apnea   3. Fatigue, unspecified type   4. Skin lesion   5. Colon cancer screening   6. Need for Tdap vaccination   7. Type 2 diabetes mellitus without complication, without long-term current use of insulin (HCC)   8. Hyperlipidemia, unspecified hyperlipidemia type   9. Vitamin D deficiency   10. Healthcare maintenance      Plan: 1.-3.  We will refer her to neurology for sleep study. 4.  Will refer to dermatology to evaluate his skin lesions for possible cancer. 5.  We will refer to gastroenterology for colon cancer screening. 6.,  10.  We will administer tetanus shot today.  He will be due for PPSV23 at next office visit.  May also consider checking hepatitis C, urine for microalbuminuria, screening for HIV, and completing foot exam at next office visit when we do blood work. 7.-8.  He is encouraged to continue focusing on lifestyle changes and we will consider rechecking A1c and lipid panel at next office visit. 9.  He was encouraged to start taking a vitamin D3 supplement.  I recommend he start 2000 IUs by mouth daily.  Consider checking serum vitamin D level at next office visit as well.   Tests ordered Orders Placed This Encounter  Procedures  . Tdap vaccine greater than or equal to 7yo IM  . Ambulatory referral to Neurology  . Ambulatory referral to Dermatology  . Ambulatory referral to Gastroenterology  No  orders of the defined types were placed in this encounter.   Patient to follow-up in 3 months or sooner as needed.  Elenore Paddy, NP

## 2020-08-25 NOTE — Patient Instructions (Signed)
[facility] Dietary Recommendations for Weight Loss What to Avoid . Avoid added sugars o Often added sugar can be found in processed foods such as many condiments, dry cereals, cakes, cookies, chips, crisps, crackers, candies, sweetened drinks, etc.  o Read labels and AVOID/DECREASE use of foods with the following in their ingredient list: Sugar, fructose, high fructose corn syrup, sucrose, glucose, maltose, dextrose, molasses, cane sugar, brown sugar, any type of syrup, agave nectar, etc.   . Avoid snacking in between meals . Avoid foods made with flour o If you are going to eat food made with flour, choose those made with whole-grains; and, minimize your consumption as much as is tolerable . Avoid processed foods o These foods are generally stocked in the middle of the grocery store. Focus on shopping on the perimeter of the grocery.  . Avoid Meat  o We recommend following a plant-based diet at [facility]. Thus, we recommend avoiding meat as a general rule. Consider eating beans, legumes, eggs, and/or dairy products for regular protein sources o If you plan on eating meat limit to 4 ounces of meat at a time and choose lean options such as Fish, chicken, turkey. Avoid red meat intake such as pork and/or steak What to Include . Vegetables o GREEN LEAFY VEGETABLES: Kale, spinach, mustard greens, collard greens, cabbage, broccoli, etc. o OTHER: Asparagus, cauliflower, eggplant, carrots, peas, Brussel sprouts, tomatoes, bell peppers, zucchini, beets, cucumbers, etc. . Grains, seeds, and legumes o Beans: kidney beans, black eyed peas, garbanzo beans, black beans, pinto beans, etc. o Whole, unrefined grains: brown rice, barley, bulgur, oatmeal, etc. . Healthy fats  o Avoid highly processed fats such as vegetable oil o Examples of healthy fats: avocado, olives, virgin olive oil, dark chocolate (?72% Cocoa), nuts (peanuts, almonds, walnuts, cashews, pecans, etc.) . None to Low  Intake of Animal Sources of Protein o Meat sources: chicken, turkey, salmon, tuna. Limit to 4 ounces of meat at one time. o Consider limiting dairy sources, but when choosing dairy focus on: PLAIN Greek yogurt, cottage cheese, high-protein milk . Fruit o Choose berries  When to Eat . Intermittent Fasting: o Choosing not to eat for a specific time period, but DO FOCUS ON HYDRATION when fasting o Multiple Techniques: - Time Restricted Eating: eat 3 meals in a day, each meal lasting no more than 60 minutes, no snacks between meals - 16-18 hour fast: fast for 16 to 18 hours up to 7 days a week. Often suggested to start with 2-3 nonconsecutive days per week.  . Remember the time you sleep is counted as fasting.  . Examples of eating schedule: Fast from 7:00pm-11:00am. Eat between 11:00am-7:00pm.  - 24-hour fast: fast for 24 hours up to every other day. Often suggested to start with 1 day per week . Remember the time you sleep is counted as fasting . Examples of eating schedule:  o Eating day: eat 2-3 meals on your eating day. If doing 2 meals, each meal should last no more than 90 minutes. If doing 3 meals, each meal should last no more than 60 minutes. Finish last meal by 7:00pm. o Fasting day: Fast until 7:00pm.  o IF YOU FEEL UNWELL FOR ANY REASON/IN ANY WAY WHEN FASTING, STOP FASTING BY EATING A NUTRITIOUS SNACK OR LIGHT MEAL o ALWAYS FOCUS ON HYDRATION DURING FASTS - Acceptable Hydration sources: water, broths, tea/coffee (black tea/coffee is best but using a small amount of whole-fat dairy products in coffee/tea is acceptable).  -   Poor Hydration Sources: anything with sugar or artificial sweeteners added to it  These recommendations have been developed for patients that are actively receiving medical care from either Dr. Karilyn Cota or Jiles Prows, DNP, NP-C at [facility]. These recommendations are developed for patients with specific medical conditions and are not meant to be  distributed or used by others that are not actively receiving care from either provider listed above at [facility]. It is not appropriate to participate in the above eating plans without proper medical supervision.   Reference: Lawrence Santiago. The obesity code. [place]; 2016.      Why follow it? Research shows. Those who follow the Mediterranean diet have a reduced risk of heart disease  The diet is associated with a reduced incidence of Parkinson's and Alzheimer's diseases People following the diet may have longer life expectancies and lower rates of chronic diseases  The Dietary Guidelines for Americans recommends the Mediterranean diet as an eating plan to promote health and prevent disease  What Is the Mediterranean Diet?  Healthy eating plan based on typical foods and recipes of Mediterranean-style cooking The diet is primarily a plant based diet; these foods should make up a majority of meals   Starches - Plant based foods should make up a majority of meals - They are an important sources of vitamins, minerals, energy, antioxidants, and fiber - Choose whole grains, foods high in fiber and minimally processed items  - Typical grain sources include wheat, oats, barley, corn, brown rice, bulgar, farro, millet, polenta, couscous  - Various types of beans include chickpeas, lentils, fava beans, black beans, white beans   Fruits  Veggies - Large quantities of antioxidant rich fruits & veggies; 6 or more servings  - Vegetables can be eaten raw or lightly drizzled with oil and cooked  - Vegetables common to the traditional Mediterranean Diet include: artichokes, arugula, beets, broccoli, brussel sprouts, cabbage, carrots, celery, collard greens, cucumbers, eggplant, kale, leeks, lemons, lettuce, mushrooms, okra, onions, peas, peppers, potatoes, pumpkin, radishes, rutabaga, shallots, spinach, sweet potatoes, turnips, zucchini - Fruits common to the Mediterranean Diet  include: apples, apricots, avocados, cherries, clementines, dates, figs, grapefruits, grapes, melons, nectarines, oranges, peaches, pears, pomegranates, strawberries, tangerines  Fats - Replace butter and margarine with healthy oils, such as olive oil, canola oil, and tahini  - Limit nuts to no more than a handful a day  - Nuts include walnuts, almonds, pecans, pistachios, pine nuts  - Limit or avoid candied, honey roasted or heavily salted nuts - Olives are central to the [employer] - can be eaten whole or used in a variety of dishes   Meats Protein - Limiting red meat: no more than a few times a month - When eating red meat: choose lean cuts and keep the portion to the size of deck of cards - Eggs: approx. 0 to 4 times a week  - Fish and lean poultry: at least 2 a week  - Healthy protein sources include, chicken, [place], lean beef, lamb - Increase intake of seafood such as tuna, salmon, trout, mackerel, shrimp, scallops - Avoid or limit high fat processed meats such as sausage and bacon  Dairy - Include moderate amounts of low fat dairy products  - Focus on healthy dairy such as fat free yogurt, skim milk, low or reduced fat cheese - Limit dairy products higher in fat such as whole or 2% milk, cheese, ice cream  Alcohol - Moderate amounts of red wine is ok  -  No more than 5 oz daily for women (all ages) and men older than age 18  - No more than 10 oz of wine daily for men younger than 72  Other - Limit sweets and other desserts  - Use herbs and spices instead of salt to flavor foods  - Herbs and spices common to the traditional Mediterranean Diet include: basil, bay leaves, chives, cloves, cumin, fennel, garlic, lavender, marjoram, mint, oregano, parsley, pepper, rosemary, sage, savory, sumac, tarragon, thyme   It's not just a diet, it's a lifestyle:  The Mediterranean diet includes lifestyle factors typical of those in the region  Foods, drinks and meals are best eaten with others  and savored Daily physical activity is important for overall good health This could be strenuous exercise like running and aerobics This could also be more leisurely activities such as walking, housework, yard-work, or taking the stairs Moderation is the key; a balanced and healthy diet accommodates most foods and drinks Consider portion sizes and frequency of consumption of certain foods   Meal Ideas & Options:  Breakfast:  Whole wheat toast or whole wheat English muffins with peanut butter & hard boiled egg Steel cut oats topped with apples & cinnamon and skim milk  Fresh fruit: banana, strawberries, melon, berries, peaches  Smoothies: strawberries, bananas, greek yogurt, peanut butter Low fat greek yogurt with blueberries and granola  Egg white omelet with spinach and mushrooms Breakfast couscous: whole wheat couscous, apricots, skim milk, cranberries  Sandwiches:  Hummus and grilled vegetables (peppers, zucchini, squash) on whole wheat bread   Grilled chicken on whole wheat pita with lettuce, tomatoes, cucumbers or tzatziki  [place] salad on whole wheat bread: tuna salad made with greek yogurt, olives, red peppers, capers, green onions Garlic rosemary lamb pita: lamb sauted with garlic, rosemary, salt & pepper; add lettuce, cucumber, greek yogurt to pita - flavor with lemon juice and black pepper  Seafood:  Mediterranean grilled salmon, seasoned with garlic, basil, parsley, lemon juice and black pepper Shrimp, lemon, and spinach whole-grain pasta salad made with low fat greek yogurt  Seared scallops with lemon orzo  Seared tuna steaks seasoned salt, pepper, coriander topped with tomato mixture of olives, tomatoes, olive oil, minced garlic, parsley, green onions and cappers  Meats:  Herbed greek chicken salad with kalamata olives, cucumber, feta  Red bell peppers stuffed with spinach, bulgur, lean ground beef (or lentils) & topped with feta   Kebabs: skewers of chicken, tomatoes,  onions, zucchini, squash  [place] burgers: made with red onions, mint, dill, lemon juice, feta cheese topped with roasted red peppers Vegetarian Cucumber salad: cucumbers, artichoke hearts, celery, red onion, feta cheese, tossed in olive oil & lemon juice  Hummus and whole grain pita points with a greek salad (lettuce, tomato, feta, olives, cucumbers, red onion) Lentil soup with celery, carrots made with vegetable broth, garlic, salt and pepper  Tabouli salad: parsley, bulgur, mint, scallions, cucumbers, tomato, radishes, lemon juice, olive oil, salt and pepper.

## 2020-08-26 ENCOUNTER — Encounter (INDEPENDENT_AMBULATORY_CARE_PROVIDER_SITE_OTHER): Payer: Self-pay | Admitting: *Deleted

## 2020-11-10 ENCOUNTER — Encounter: Payer: Self-pay | Admitting: Neurology

## 2020-11-10 ENCOUNTER — Ambulatory Visit (INDEPENDENT_AMBULATORY_CARE_PROVIDER_SITE_OTHER): Payer: 59 | Admitting: Neurology

## 2020-11-10 VITALS — BP 133/83 | HR 86 | Ht 73.0 in | Wt 247.0 lb

## 2020-11-10 DIAGNOSIS — E119 Type 2 diabetes mellitus without complications: Secondary | ICD-10-CM | POA: Diagnosis not present

## 2020-11-10 DIAGNOSIS — G4710 Hypersomnia, unspecified: Secondary | ICD-10-CM | POA: Diagnosis not present

## 2020-11-10 DIAGNOSIS — G4719 Other hypersomnia: Secondary | ICD-10-CM | POA: Diagnosis not present

## 2020-11-10 DIAGNOSIS — R0683 Snoring: Secondary | ICD-10-CM | POA: Insufficient documentation

## 2020-11-10 DIAGNOSIS — J351 Hypertrophy of tonsils: Secondary | ICD-10-CM | POA: Diagnosis not present

## 2020-11-10 DIAGNOSIS — G4730 Sleep apnea, unspecified: Secondary | ICD-10-CM

## 2020-11-10 NOTE — Progress Notes (Signed)
SLEEP MEDICINE CLINIC    Provider:  Melvyn Novas, MD  Primary Care Physician:  Wilson Singer, MD [address] [place] [address]     Referring Provider: Wilson Singer, Md [address] [place],  [place] [address]          Chief Complaint according to patient   Patient presents with:     New Patient (Initial Visit)      Pt c/o of snoring and and fatigue, ongoing for years. Pt had a sleep study ordered years ago and never had it done. Pts wife states she hears him stop breathing almost every night. Pt has issues with grinding his teeth. Pt is able to fall asleep within second anywhere, but has trouble staying asleep at night.       HISTORY OF PRESENT ILLNESS:  Jon Washington is a 55 y.o. year old White or Caucasian male patient  of Dr Karilyn Cota seen here on 11/10/2020 . Chief concern according to patient :  My wife wants me to have a sleep study because I am so easy to go to sleep, but I wake up a lot during the night, I have right shoulder pain, this means I sleep mostly on my left- if on my back I choke and snore very loud.    I have the pleasure of seeing Jon Washington today, a right-handed Caucasian male with a possible sleep disorder. He  has a medical history of Chronic low back pain related to a military service injury , HLD (hyperlipidemia) (06/13/2016), Hypertension, Obesity, and Sleep apnea. The patient never had a sleep study in the past.    Sleep relevant medical history: Nocturia 1-2 times, no Tonsillectomy, wisdom teeth removed.    Family medical /sleep history: No  other family member on CPAP with OSA, insomnia, sleep walkers.    Social history:  Patient is working as a Scientist, physiological.  and lives in a household with spouse and daughter. 2 elder daughters have left the home.  The patient currently works in daytime now- but used to work in shifts( Chief Technology Officer,) 5 dogs present.- they used to breed. Tobacco use-  quit 1999.  ETOH use -none ,  Caffeine intake in form of Coffee( /) Soda( 10 a week ) Tea ( /) or energy drinks. Regular exercise in form of physical work .      Sleep habits are as follows: The patient's dinner time is between 6-7 PM. The patient goes to bed at 11-12 PM and continues to sleep for 4-6 hours. The preferred sleep position is left side, with the support of 1-2 pillows.  Dreams are reportedly rare.  5  AM is the usual rise time. The patient wakes up with an alarm.  He reports not feeling refreshed or restored in AM, with symptoms such as dry mouth, but no morning headaches, yet reports residual fatigue. Naps are  unscheduled- taken frequently, lasting from 10 to 60 minutes and are more refreshing .    Review of Systems: Out of a complete 14 system review, the patient complains of only the following symptoms, and all other reviewed systems are negative.:  Fatigue, sleepiness , snoring, fragmented sleep, Insomnia sleep restriction.    How likely are you to doze in the following situations: 0 = not likely, 1 = slight chance, 2 = moderate chance, 3 = high chance   Sitting and Reading? Watching Television? Sitting inactive in a public place (theater  or meeting)? As a passenger in a car for an hour without a break? Lying down in the afternoon when circumstances permit? Sitting and talking to someone? Sitting quietly after lunch without alcohol? In a car, while stopped for a few minutes in traffic?   Total = 18/ 24 points   FSS endorsed at 31/ 63 points.    vPt c/o of snoring and and fatigue, ongoing for years. Pt had a sleep study ordered years ago and never had it done. Pts wife states she hears him stop breathing almost every night. Pt has issues with grinding his teeth. Pt is ableto fall asleep within second anywhere, but has trouble staying asleep at   Social History   Socioeconomic History   Marital status: Married    Spouse name: Marisue Ivan   Number of children: 3   Years  of education: 12   Highest education level: Not on file  Occupational History   Occupation: Holiday representative  Tobacco Use   Smoking status: Former    Packs/day: 1.00    Pack years: 0.00    Types: Cigarettes    Start date: 05/01/1980    Quit date: 05/01/1997    Years since quitting: 23.5   Smokeless tobacco: Never  Vaping Use   Vaping Use: Former  Substance and Sexual Activity   Alcohol use: No   Drug use: No   Sexual activity: Yes    Birth control/protection: None    Comment: wife PCOS  Other Topics Concern   Not on file  Social History Narrative   Married for 14 years.Wife Marisue Ivan   Lives with wife and daughter Bonnee Quin age 52, and 5 dogs   Veteran - [employer] 6 years.   Pipe fitter.   Caffeine: diet soda 8-10 a week   Right handed   Social Determinants of Health   Financial Resource Strain: Not on file  Food Insecurity: Not on file  Transportation Needs: Not on file  Physical Activity: Not on file  Stress: Not on file  Social Connections: Not on file    Family History  Problem Relation Age of Onset   Thyroid disease Mother    Alcohol abuse Father    Arthritis Father    Cancer Father        lymphoma   COPD Father    Heart disease Father        irreg beat   Heart disease Sister 70       CAD   Hypertension Sister    Cancer Sister        hodgkins, breast, skin   Breast cancer Sister    Heart disease Maternal Uncle    Mental illness Maternal Grandmother        social anxiety   Heart disease Paternal Grandfather 26       heart attack   SIDS Daughter     Past Medical History:  Diagnosis Date   Chronic back pain    HLD (hyperlipidemia) 06/13/2016   Hypertension    Sleep apnea     Past Surgical History:  Procedure Laterality Date   APPENDECTOMY  1974   HERNIA REPAIR  1978     Current Outpatient Medications on File Prior to Visit  Medication Sig Dispense Refill   Cholecalciferol 50 MCG (2000 UT) CHEW Chew 2,000 Int'l Units by mouth daily.     No current  facility-administered medications on file prior to visit.    Physical exam:  Today's Vitals   11/10/20 1255  BP:  133/83  Pulse: 86  Weight: 247 lb (112 kg)  Height: 6\' 1"  (1.854 m)   Body mass index is 32.59 kg/m.   Wt Readings from Last 3 Encounters:  11/10/20 247 lb (112 kg)  08/25/20 248 lb (112.5 kg)  06/18/20 254 lb (115.2 kg)     Ht Readings from Last 3 Encounters:  11/10/20 6\' 1"  (1.854 m)  08/25/20 6\' 1"  (1.854 m)  06/18/20 6\' 1"  (1.854 m)      General: The patient is awake, alert and appears not in acute distress. The patient is cooperative , has facial hair.  Head: Normocephalic, atraumatic. Neck is supple. Mallampati 3, tonsils kiss the uvula. 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  Nasal airflow congested- barely patent.  Retrognathia is seen.  Dental status: irregular Cardiovascular:  Regular rate and cardiac rhythm by pulse,  without distended neck veins. Respiratory: Lungs are clear to auscultation.  Skin:  Without evidence of ankle edema, or rash. Trunk: The patient's posture is erect.   Neurologic exam : The patient is awake and alert, oriented to place and time.   Memory subjective described as intact.  Attention span & concentration ability appears normal.  Speech is fluent,  without  dysarthria, dysphonia or aphasia.  Mood and affect are appropriate.   Cranial nerves: no loss of smell or taste reported  Pupils are equal and briskly reactive to light. Funduscopic exam deferred. .  Extraocular movements in vertical and horizontal planes were intact and without nystagmus. No Diplopia. Visual fields by finger perimetry are intact. Hearing was intact to soft voice and finger rubbing.    Facial sensation intact to fine touch.  Facial appearance with right ptosis and mouth on the  right is  drooping. The   tongue and uvula move midline.  Neck ROM : rotation, tilt and flexion extension  were normal for age and shoulder shrug was symmetrical.  Right shoulder ROM is not affected by a rotator cuff injury.    Motor exam:  Symmetric bulk, tone and ROM.   Normal tone without cog- wheeling, symmetric grip strength .   Sensory:  nerve pain, travelling from the right shoulder to travel on the biceps anterior aspect , burning sensation stops past the elbow. Fine touch, pinprick and vibration were tested  and  normal.  Proprioception tested in the upper extremities was normal.   Coordination: Rapid alternating movements in the fingers/hands were of normal speed.  The Finger-to-nose maneuver was intact without evidence of ataxia, dysmetria or tremor.   Gait and station: Patient could rise unassisted from a seated position, walked without assistive device.  Stance is of normal width/ base and the patient turned with 3 steps.  Toe and heel walk were deferred.  Deep tendon reflexes: in the  upper and lower extremities are symmetric and intact.  Babinski response was deferred.       After spending a total time of  45  minutes face to face and additional time for physical and neurologic examination, review of laboratory studies,  personal review of imaging studies, reports and results of other testing and review of referral information / records as far as provided in visit, I have established the following assessments:  1) excessive daytime sleepiness, in the setting of snoring and witnessed apneas.  2) high grade airway obstruction by tonsils and irregular dentition- crowded. 3) restricted sleep time.    My Plan is  to proceed with:  1) This patient needs a sleep test to screen and determine the degree of apnea.  2) HST is  faster available than SPLIT lab study and I will prefer this way to get therapy initiated ASAP.    I would like to thank Wilson Singer, MD [address] [place],  [place] [address] for allowing me to meet with and to take care of this pleasant patient.   In  short, Jon Washington is presenting with EDS. I plan to follow up either personally or through our NP within 2-4 month.   CC: I will share my notes with PCP. Marland Kitchen  Electronically signed by: Melvyn Novas, MD 11/10/2020 1:14 PM  [facility] Board certified by The [employer] of Sleep Medicine and Diplomate of the [employer] of Sleep Medicine. Board certified In Neurology through the ABPN, Fellow of the [employer] of Neurology. Medical Director of [facility].

## 2020-11-10 NOTE — Patient Instructions (Signed)
Hypersomnia Hypersomnia is a condition in which a person feels very tired during the day even though he or she gets plenty of sleep at night. A person with this condition may take naps during the day and may find it very difficult to wake up from sleep. Hypersomnia may affect a person's ability to think, concentrate,drive, or remember things. What are the causes? The cause of this condition may not be known. Possible causes include: Certain medicines. Sleep disorders, such as narcolepsy and sleep apnea. Injury to the head, brain, or spinal cord. Drug or alcohol use. Gastroesophageal reflux disease (GERD). Tumors. Certain medical conditions, such as depression, diabetes, or an underactive thyroid gland (hypothyroidism). What are the signs or symptoms? The main symptoms of hypersomnia include: Feeling very tired throughout the day, regardless of how much sleep you got the night before. Having trouble waking up. Others may find it difficult to wake you up when you are sleeping. Sleeping for longer and longer periods at a time. Taking naps throughout the day. Other symptoms may include: Feeling restless, anxious, or annoyed. Lacking energy. Having trouble with: Remembering. Speaking. Thinking. Loss of appetite. Seeing, hearing, tasting, smelling, or feeling things that are not real (hallucinations). How is this diagnosed? This condition may be diagnosed based on: Your symptoms and medical history. Your sleeping habits. Your health care provider may ask you to write down your sleeping habits in a daily sleep log, along with any symptoms you have. A series of tests that are done while you sleep (sleep study or polysomnogram). A test that measures how quickly you can fall asleep during the day (daytime nap study or multiple sleep latency test). How is this treated? Treatment can help you manage your condition. Treatment may include: Following a regular sleep routine. Lifestyle changes,  such as changing your eating habits, getting regular exercise, and avoiding alcohol or caffeinated beverages. Taking medicines to make you more alert (stimulants) during the day. Treating any underlying medical causes of hypersomnia. Follow these instructions at home: Sleep routine  Schedule the same bedtime and wake-up time each day. Practice a relaxing bedtime routine. This may include reading, meditation, deep breathing, or taking a warm bath before going to sleep. Get regular exercise each day. Avoid strenuous exercise in the evening hours. Keep your sleep environment at a cooler temperature, darkened, and quiet. Sleep with pillows and a mattress that are comfortable and supportive. Schedule short 20-minute naps for when you feel sleepiest during the day. Talk with your employer or teachers about your hypersomnia. If possible, adjust your schedule so that: You have a regular daytime work schedule. You can take a scheduled nap during the day. You do not have to work or be active at night. Do not eat a heavy meal for a few hours before bedtime. Eat your meals at about the same times every day. Avoid drinking alcohol or caffeinated beverages.  Safety  Do not drive or use heavy machinery if you are sleepy. Ask your health care provider if it is safe for you to drive. Wear a life jacket when swimming or spending time near water.  General instructions Take supplements and over-the-counter and prescription medicines only as told by your health care provider. Keep a sleep log that will help your doctor manage your condition. This may include information about: What time you go to bed each night. How often you wake up at night. How many hours you sleep at night. How often and for how long you nap during the   day. Any observations from others, such as leg movements during sleep, sleep walking, or snoring. Keep all follow-up visits as told by your health care provider. This is  important. Contact a health care provider if: You have new symptoms. Your symptoms get worse. Get help right away if: You have serious thoughts about hurting yourself or someone else. If you ever feel like you may hurt yourself or others, or have thoughts about taking your own life, get help right away. You can go to your nearest emergency department or call: Your local emergency services (911 in the [place] A suicide crisis helpline, such as the National Suicide Prevention Lifeline at 218-437-0995. This is open 24 hours a day. Summary Hypersomnia refers to a condition in which you feel very tired during the day even though you get plenty of sleep at night. A person with this condition may take naps during the day and may find it very difficult to wake up from sleep. Hypersomnia may affect a person's ability to think, concentrate, drive, or remember things. Treatment, such as following a regular sleep routine and making some lifestyle changes, can help you manage your condition. This information is not intended to replace advice given to you by your health care provider. Make sure you discuss any questions you have with your healthcare provider. Document Revised: 02/26/2020 Document Reviewed: 02/26/2020 Elsevier Patient Education  2022 Elsevier Inc. Screening for Sleep Apnea  Sleep apnea is a condition in which breathing pauses or becomes shallow during sleep. Sleep apnea screening is a test to determine if you are at risk for sleep apnea. The test includes a series of questions. It will only takes a few minutes. Your health care provider may ask you to have this test in preparationfor surgery or as part of a physical exam. What are the symptoms of sleep apnea? Common symptoms of sleep apnea include: Snoring. Waking up often at night. Daytime sleepiness. Pauses in breathing. Choking or gasping during sleep. Irritability. Forgetfulness. Trouble thinking  clearly. Depression. Personality changes. Most people with sleep apnea do not know that they have it. What are the advantages of sleep apnea screening? Getting screened for sleep apnea can help: Ensure your safety. It is important for your health care providers to know whether or not you have sleep apnea, especially if you are having surgery or have other long-term (chronic) health conditions. Improve your health and allow you to get a better night's rest. Restful sleep can help you: Have more energy. Lose weight. Improve high blood pressure. Improve diabetes management. Prevent stroke. Prevent car accidents. What happens during the screening? Screening usually includes being asked a list of questions about your sleep quality. Some questions you may be asked include: Do you snore? Is your sleep restless? Do you have daytime sleepiness? Has a partner or spouse told you that you stop breathing during sleep? Have you had trouble concentrating or memory loss? What is your age? What is your neck circumference? To measure your neck, keep your back straight and gently wrap the tape measure around your neck. Put the tape measure at the middle of your neck, between your chin and collarbone. What is your sex assigned at birth? Do you have or are you being treated for high blood pressure? If your screening test is positive, you are at risk for the condition. Furthertesting may be needed to confirm a diagnosis of sleep apnea. Where to find more information You can find screening tools online or at your health care clinic.  For more information about sleep apnea screening and healthy sleep, visit these websites: Centers for Disease Control and Prevention: [employer]Ex[employer]com.br American Sleep Apnea Association: www.sleepapnea.org Contact a health care provider if: You think that you may have sleep apnea. Summary Sleep apnea screening can help determine if you are at risk for sleep apnea. It is important  for your health care providers to know whether or not you have sleep apnea, especially if you are having surgery or have other chronic health conditions. You may be asked to take a screening test for sleep apnea in preparation for surgery or as part of a physical exam. This information is not intended to replace advice given to you by your health care provider. Make sure you discuss any questions you have with your healthcare provider. Document Revised: 03/26/2020 Document Reviewed: 03/26/2020 Elsevier Patient Education  2022 [employer].

## 2020-11-23 ENCOUNTER — Encounter (INDEPENDENT_AMBULATORY_CARE_PROVIDER_SITE_OTHER): Payer: Self-pay | Admitting: Nurse Practitioner

## 2020-11-25 ENCOUNTER — Ambulatory Visit (INDEPENDENT_AMBULATORY_CARE_PROVIDER_SITE_OTHER): Payer: 59 | Admitting: Nurse Practitioner

## 2020-12-09 ENCOUNTER — Ambulatory Visit (INDEPENDENT_AMBULATORY_CARE_PROVIDER_SITE_OTHER): Payer: 59 | Admitting: Nurse Practitioner

## 2020-12-20 ENCOUNTER — Ambulatory Visit (INDEPENDENT_AMBULATORY_CARE_PROVIDER_SITE_OTHER): Payer: 59 | Admitting: Neurology

## 2020-12-20 DIAGNOSIS — E119 Type 2 diabetes mellitus without complications: Secondary | ICD-10-CM

## 2020-12-20 DIAGNOSIS — G4719 Other hypersomnia: Secondary | ICD-10-CM

## 2020-12-20 DIAGNOSIS — G4733 Obstructive sleep apnea (adult) (pediatric): Secondary | ICD-10-CM | POA: Diagnosis not present

## 2020-12-20 DIAGNOSIS — G4730 Sleep apnea, unspecified: Secondary | ICD-10-CM

## 2020-12-20 DIAGNOSIS — J351 Hypertrophy of tonsils: Secondary | ICD-10-CM

## 2020-12-20 DIAGNOSIS — G4710 Hypersomnia, unspecified: Secondary | ICD-10-CM

## 2020-12-20 DIAGNOSIS — R0683 Snoring: Secondary | ICD-10-CM

## 2020-12-28 NOTE — Progress Notes (Signed)
[facility] SLEEP TEST REPORT ( by Watch PAT)   STUDY DATE:  12-20-2020 DATA AVAILABLE by 12-28-2020 DOB:  [birth year] MRN: [id number]   ORDERING CLINICIAN: Melvyn Novas, MD  REFERRING CLINICIAN: Lilly Cove, MD    CLINICAL INFORMATION/HISTORY: Jon Washington is a 55 - year- old - Caucasian male patient of Dr Patty Sermons and seen here on 11/09/2020 . Chief concern according to patient :  I am very sleepy in daytime, feels never refreshed, rested. My wife wants me to have a sleep study because I am so easy to go to sleep, but I wake up a lot during the night. I have right shoulder pain, this means I sleep mostly on my left- if I sleep on my back I choke and snore very loud. He has a medical history of Chronic low back pain related to a military service injury , HLD (hyperlipidemia) (06/13/2016), Hypertension, Obesity, and never had a sleep study in the past.   Epworth sleepiness score:  18/24.   BMI: 32.7 kg/m   Neck Circumference: 19"    Sleep Summary:   Total Recording Time (hours, min): The total recording time amounted to 7 hours and 23 minutes of which only 4 hours and 18 minutes were calculated sleep time.  Of these 3.7% for calculated REM sleep time.    Respiratory Indices:   Calculated pAHI (per hour):     The patient's AHI overall was 62.7 for a 4% desaturation level.  REM and non-REM sleep were not differentiated by AHI.  The patient had very loud snoring with a mean volume of 43 dB.   Nonsupine sleep was only marginally less affected by apnea in supine sleep.   Supine sleep AHI was 81.7, prone sleep 71.2 AHI, sleep on the right side amounted to an AHI of 45.7/h and on the left 71.4/h.                                                                                            Oxygen Saturation Statistics:  SpO2 /O2 Saturation Range (%): Oxygen saturation at nadir was 78% with a maximum saturation of 99% and a mean oxygen saturation of 92%.  Total  time below 89% oxygenation amounted to 28.5 minutes or 11% of the total sleep time.   Hypoxia lower than 90% saturation amounted to 39.8 minutes or 15.4% of total sleep time.                                        Pulse Rate Statistics             Pulse Range: Varied between 46 bpm and 146 bpm with a mean heart rate of 72 bpm.  Please note that this Home sleep test cannot give evidence of cardiac arrhythmia.                IMPRESSION:  This HST confirms the presence of very severe presumed obstructive sleep apnea, based on a 4-hour 18-minute total sleep recording.  There were bradycardia and tachycardia present at night, the total time was hypoxemia was prolonged and the nadir was 78% saturation.    Based on this, there is only positive airway pressure therapy to improve all aspects of this apnea.   RECOMMENDATION: I will start with an auto titration device between 5 and 20 cm water pressure with 3 cm EPR and a mask of patient's choice.  He does have a high-grade upper airway obstruction by large tonsils and a regular dentition crowded dental status.  However a dental device would not be sufficient to correct his degree of apnea neither with a inspire device.  I will asked the patient to follow-up with him 2 to 3 months after initiation of CPAP therapy with one of our nurse practitioners we will also likely need a pulse oximetry to follow to make sure that the optimal CPAP pressure for reduction of AHI also improves hypoxia.    INTERPRETING PHYSICIAN:Carmen Dohmeier, MD   Medical Director of [facility].

## 2020-12-30 ENCOUNTER — Telehealth: Payer: Self-pay | Admitting: *Deleted

## 2020-12-30 ENCOUNTER — Encounter: Payer: Self-pay | Admitting: *Deleted

## 2020-12-30 ENCOUNTER — Other Ambulatory Visit: Payer: Self-pay | Admitting: *Deleted

## 2020-12-30 DIAGNOSIS — R0902 Hypoxemia: Secondary | ICD-10-CM

## 2020-12-30 NOTE — Progress Notes (Signed)
IMPRESSION:  This HST confirms the presence of very severe presumed obstructive sleep apnea, based on a 4-hour 18-minute total sleep recording.   There were bradycardia and tachycardia present at night, the total time was hypoxemia was prolonged and the nadir was 78% saturation.    Based on this, there is only positive airway pressure therapy to improve all aspects of this apnea.  RECOMMENDATION: I will start with an auto titration humidified  CPAP device between 5 and 20 cm water pressure with 3 cm EPR and a mask of patient's choice.    He does have a high-grade upper airway obstruction by large tonsils and a regular dentition crowded dental status.  However, a dental device would not be sufficient to correct his degree of apnea neither with a inspire device.  I will asked the patient to follow-up with him 2 to 3 months after initiation of CPAP therapy with one of our nurse practitioners we will also likely need a pulse oximetry to follow to make sure that the optimal CPAP pressure for reduction of AHI also improves hypoxia.   INTERPRETING PHYSICIAN:Ericberto Padget, MD   Medical Director of [facility].

## 2020-12-30 NOTE — Telephone Encounter (Signed)
I called pt. I advised pt that Dr. Vickey Huger reviewed their sleep study results and found that pt has severe sleep apnea. Dr. Vickey Huger recommends that pt start cpap. I reviewed PAP compliance expectations with the pt. Pt is agreeable to starting a CPAP. I advised pt that an order will be sent to a DME, [facility], and [facility] will call the pt within about one week after they file with the pt's insurance. [facility] will show the pt how to use the machine, fit for masks, and troubleshoot the CPAP if needed. A follow up appt was made for insurance purposes with Dr. Vickey Huger on 04/04/2021 at 3:30pm. Pt verbalized understanding to arrive 15 minutes early and bring their CPAP. A letter with all of this information in it will be mailed to the pt as a reminder. I verified with the pt that the address we have on file is correct. Pt verbalized understanding of results. Pt had no questions at this time but was encouraged to call back if questions arise. I have sent the order to [facility] and have received confirmation that they have received the order.

## 2020-12-30 NOTE — Addendum Note (Signed)
Addended by: Melvyn Novas on: 12/30/2020 12:23 PM   Modules accepted: Orders

## 2020-12-30 NOTE — Telephone Encounter (Signed)
-----   Message from Melvyn Novas, MD sent at 12/30/2020 12:23 PM EDT ----- IMPRESSION:  This HST confirms the presence of very severe presumed obstructive sleep apnea, based on a 4-hour 18-minute total sleep recording.   There were bradycardia and tachycardia present at night, the total time was hypoxemia was prolonged and the nadir was 78% saturation.    Based on this, there is only positive airway pressure therapy to improve all aspects of this apnea.  RECOMMENDATION: I will start with an auto titration humidified  CPAP device between 5 and 20 cm water pressure with 3 cm EPR and a mask of patient's choice.    He does have a high-grade upper airway obstruction by large tonsils and a regular dentition crowded dental status.  However, a dental device would not be sufficient to correct his degree of apnea neither with a inspire device.  I will asked the patient to follow-up with him 2 to 3 months after initiation of CPAP therapy with one of our nurse practitioners we will also likely need a pulse oximetry to follow to make sure that the optimal CPAP pressure for reduction of AHI also improves hypoxia.   INTERPRETING PHYSICIAN:Carmen Dohmeier, MD   Medical Director of [facility].

## 2020-12-30 NOTE — Procedures (Signed)
  [facility]   HOME SLEEP TEST REPORT ( by [facility] PAT)   STUDY DATE:  12-20-2020 DATA AVAILABLE by 12-28-2020 DOB:  07/05/1965 MRN: [id number]   ORDERING CLINICIAN: Toyia Jelinek, MD  REFERRING CLINICIAN: Nimish Gosrani, MD    CLINICAL INFORMATION/HISTORY: Jon Washington is a 55 - year- old - Caucasian male patient of Dr Gosrani's and seen here on 11/09/2020 . Chief concern according to patient :  I am very sleepy in daytime, feels never refreshed, rested. My wife wants me to have a sleep study because I am so easy to go to sleep, but I wake up a lot during the night. I have right shoulder pain, this means I sleep mostly on my left- if I sleep on my back I choke and snore very loud. He has a medical history of Chronic low back pain related to a military service injury , HLD (hyperlipidemia) (06/13/2016), Hypertension, Obesity, and never had a sleep study in the past.   Epworth sleepiness score:  18/24.   BMI: 32.7 kg/m   Neck Circumference: 19"    Sleep Summary:   Total Recording Time (hours, min): The total recording time amounted to 7 hours and 23 minutes of which only 4 hours and 18 minutes were calculated sleep time.  Of these 3.7% for calculated REM sleep time.    Respiratory Indices:   Calculated pAHI (per hour):     The patient's AHI overall was 62.7 for a 4% desaturation level.  REM and non-REM sleep were not differentiated by AHI.  The patient had very loud snoring with a mean volume of 43 dB.   Nonsupine sleep was only marginally less affected by apnea in supine sleep.   Supine sleep AHI was 81.7, prone sleep 71.2 AHI, sleep on the right side amounted to an AHI of 45.7/h and on the left 71.4/h.                                                                                            Oxygen Saturation Statistics:  SpO2 /O2 Saturation Range (%): Oxygen saturation at nadir was 78% with a maximum saturation of 99% and a mean oxygen saturation of 92%.  Total  time below 89% oxygenation amounted to 28.5 minutes or 11% of the total sleep time.   Hypoxia lower than 90% saturation amounted to 39.8 minutes or 15.4% of total sleep time.                                        Pulse Rate Statistics             Pulse Range: Varied between 46 bpm and 146 bpm with a mean heart rate of 72 bpm.  Please note that this Home sleep test cannot give evidence of cardiac arrhythmia.                IMPRESSION:  This HST confirms the presence of very severe presumed obstructive sleep apnea, based on a 4-hour 18-minute total sleep recording.     There were bradycardia and tachycardia present at night, the total time was hypoxemia was prolonged and the nadir was 78% saturation.    Based on this, there is only positive airway pressure therapy to improve all aspects of this apnea.   RECOMMENDATION: I will start with an auto titration device between 5 and 20 cm water pressure with 3 cm EPR and a mask of patient's choice.  He does have a high-grade upper airway obstruction by large tonsils and a regular dentition crowded dental status.  However a dental device would not be sufficient to correct his degree of apnea neither with a inspire device.  I will asked the patient to follow-up with him 2 to 3 months after initiation of CPAP therapy with one of our nurse practitioners we will also likely need a pulse oximetry to follow to make sure that the optimal CPAP pressure for reduction of AHI also improves hypoxia.    INTERPRETING PHYSICIAN:Fritzi Scripter, MD   Medical Director of [facility].             

## 2021-01-19 ENCOUNTER — Encounter: Payer: Self-pay | Admitting: Orthopedic Surgery

## 2021-01-19 ENCOUNTER — Ambulatory Visit (INDEPENDENT_AMBULATORY_CARE_PROVIDER_SITE_OTHER): Payer: 59 | Admitting: Orthopedic Surgery

## 2021-01-19 ENCOUNTER — Other Ambulatory Visit: Payer: Self-pay

## 2021-01-19 DIAGNOSIS — M19011 Primary osteoarthritis, right shoulder: Secondary | ICD-10-CM

## 2021-01-19 NOTE — Patient Instructions (Signed)

## 2021-01-19 NOTE — Progress Notes (Signed)
Orthopaedic Clinic Return  Assessment: Jon Washington is a 55 y.o. male with the following: Right shoulder glenohumeral arthritis; rotator cuff tendonitis vs chronic rotator cuff injury  Plan: Patient has a known chronic rotator cuff injury with recent worsening of his pain.  He has requested an injection.  He has previously had excellent results.  Injection completed today without issue.  No restrictions on his activity.  Follow up as needed.   Procedure note injection - Right shoulder    Verbal consent was obtained to inject the right shoulder, subacromial space Timeout was completed to confirm the site of injection.   The skin was prepped with alcohol and ethyl chloride was sprayed at the injection site.  A 21-gauge needle was used to inject 40 mg of Depo-Medrol and 1% lidocaine (3 cc) into the subacromial space of the right shoulder using a posterolateral approach.  There were no complications.  A sterile bandage was applied.    Follow-up: Return if symptoms worsen or fail to improve.   Subjective:  Chief Complaint  Patient presents with   Shoulder Pain    Right, request injection    History of Present Illness: Jon Washington is a 55 y.o. male who returns to clinic for repeat evaluation of his right shoulder.  He has a known chronic rotator cuff injury.  A couple of weeks ago, he was working and lifting and noted worsening pain in his shoulder.  Since then, his pain has gotten worse.  It is affecting his sleep.  Pain is in the anterior shoulder and radiates into his biceps.  Slightly restricted range of motion.  He previously had injections with good relief; last injection was 2 years ago.    Review of Systems: No fevers or chills No numbness or tingling No chest pain No shortness of breath No bowel or bladder dysfunction No GI distress No headaches   Objective: There were no vitals taken for this visit.  Physical Exam:  Alert and oriented.  No acute  distress  Right shoulder without deformity.  Diffuse tenderness.  No swelling.  Slightly restricted ROM.  Positive pain with drop arm.  Fingers are warm and well perfused.  2+ radial pulse. Sensation intact distally.   IMAGING: I personally ordered and reviewed the following images:  No new imaging obtained today   Oliver Barre, MD 01/19/2021 10:37 AM

## 2021-03-11 ENCOUNTER — Ambulatory Visit: Payer: 59 | Admitting: Nurse Practitioner

## 2021-03-22 ENCOUNTER — Encounter: Payer: Self-pay | Admitting: Nurse Practitioner

## 2021-03-22 ENCOUNTER — Other Ambulatory Visit: Payer: Self-pay

## 2021-03-22 ENCOUNTER — Ambulatory Visit: Payer: 59 | Admitting: Nurse Practitioner

## 2021-03-22 VITALS — BP 162/76 | HR 85 | Ht 73.0 in | Wt 249.0 lb

## 2021-03-22 DIAGNOSIS — R030 Elevated blood-pressure reading, without diagnosis of hypertension: Secondary | ICD-10-CM

## 2021-03-22 DIAGNOSIS — E559 Vitamin D deficiency, unspecified: Secondary | ICD-10-CM

## 2021-03-22 DIAGNOSIS — G4730 Sleep apnea, unspecified: Secondary | ICD-10-CM | POA: Insufficient documentation

## 2021-03-22 DIAGNOSIS — Z7689 Persons encountering health services in other specified circumstances: Secondary | ICD-10-CM

## 2021-03-22 DIAGNOSIS — E669 Obesity, unspecified: Secondary | ICD-10-CM

## 2021-03-22 DIAGNOSIS — G4733 Obstructive sleep apnea (adult) (pediatric): Secondary | ICD-10-CM | POA: Insufficient documentation

## 2021-03-22 DIAGNOSIS — E119 Type 2 diabetes mellitus without complications: Secondary | ICD-10-CM | POA: Diagnosis not present

## 2021-03-22 DIAGNOSIS — Z139 Encounter for screening, unspecified: Secondary | ICD-10-CM | POA: Insufficient documentation

## 2021-03-22 DIAGNOSIS — E785 Hyperlipidemia, unspecified: Secondary | ICD-10-CM

## 2021-03-22 MED ORDER — BLOOD PRESSURE MONITOR DEVI: 1.0000 | Freq: Every day | 0 refills | Status: DC

## 2021-03-22 MED ORDER — LISINOPRIL 5 MG PO TABS: 5.0000 mg | ORAL_TABLET | Freq: Every day | ORAL | 3 refills | Status: DC

## 2021-03-22 MED ORDER — BLOOD GLUCOSE MONITOR KIT: PACK | 0 refills | Status: DC

## 2021-03-22 NOTE — Assessment & Plan Note (Addendum)
Has obstructive sleep apnea. Recently had a sleep study. Will get his CPAP   

## 2021-03-22 NOTE — Patient Instructions (Signed)
Please get your labs done today Will get back to you about the results. You have been referred to Gastroenterologist for colonoscopy. Please get your shingles vaccine at your pharmacy Follow up in 4 weeks to recheck your BP    It is important that you exercise regularly at least 30 minutes 5 times a week.  Think about what you will eat, plan ahead. Choose " clean, green, fresh or frozen" over canned, processed or packaged foods which are more sugary, salty and fatty. 70 to 75% of food eaten should be vegetables and fruit. Three meals at set times with snacks allowed between meals, but they must be fruit or vegetables. Aim to eat over a 12 hour period , example 7 am to 7 pm, and STOP after  your last meal of the day. Drink water,generally about 64 ounces per day, no other drink is as healthy. Fruit juice is best enjoyed in a healthy way, by EATING the fruit.  Thanks for choosing [facility], we consider it a privelige to serve you.

## 2021-03-22 NOTE — Assessment & Plan Note (Signed)
BP Readings from Last 3 Encounters:  03/22/21 (!) 162/76  11/10/20 133/83  08/25/20 138/76  will start PT on Lisinopril. Importance of low salt, low fat diet and exercise discussed with pt.

## 2021-03-22 NOTE — Assessment & Plan Note (Signed)
Colonoscopy ordered, eye exam ordered. PMH and family history reviewed.

## 2021-03-22 NOTE — Assessment & Plan Note (Signed)
Importance of diet and exercise discussed with PT.

## 2021-03-22 NOTE — Progress Notes (Signed)
New Patient Office Visit  Subjective:  Patient ID: Jon Washington, male    DOB: 09-03-1965  Age: 55 y.o. MRN: [id number]  CC:  Chief Complaint  Patient presents with   New Patient (Initial Visit)    Previous PCP Dr. Anastasio Champion    HPI Jon Washington presents to establish care . Previous PT of Dr. Ricki Rodriguez. No new complaint today. PMH and family history reviewed. He is currently not taking an medication. He stated that he was previously taking lisinopril, his BP got better and it was discontinued.  Blood pressure is elevated in the office today.  Will check routine labs and start PT on medications for BP.   Sleep apnea   He recently had a sleep study and will be getting his CPAP.   He is a former smoker , he quit smoking at age 23. He had smoked 1 pack a day for about 12 years..  He sees othopedics for glenohumeral arthritis.   He  has not had a colonoscopy, foot exam due and eye exam also due. PT has not had his shinlges vaccine.    Past Medical History:  Diagnosis Date   Chronic back pain    HLD (hyperlipidemia) 06/13/2016   Hypertension    Sleep apnea     Past Surgical History:  Procedure Laterality Date   APPENDECTOMY  1974   HERNIA REPAIR  1978    Family History  Problem Relation Age of Onset   Thyroid disease Mother    Alcohol abuse Father    Arthritis Father    Cancer Father        lymphoma   COPD Father    Heart disease Father        irreg beat   Heart disease Sister 20       CAD   Hypertension Sister    Cancer Sister        hodgkins, breast, skin   Breast cancer Sister    Heart disease Maternal Uncle    Mental illness Maternal Grandmother        social anxiety   Heart disease Paternal Grandfather 46       heart attack    Social History   Socioeconomic History   Marital status: Married    Spouse name: Jon Washington   Number of children: 3   Years of education: 12   Highest education level: Not on file  Occupational History   Occupation:  Architect  Tobacco Use   Smoking status: Former    Packs/day: 1.00    Types: Cigarettes    Start date: 05/01/1980    Quit date: 05/01/1997    Years since quitting: 23.9   Smokeless tobacco: Never  Vaping Use   Vaping Use: Former   Devices: started in 1982 quit in 1994  Substance and Sexual Activity   Alcohol use: No   Drug use: No   Sexual activity: Yes    Birth control/protection: None    Comment: wife PCOS  Other Topics Concern   Not on file  Social History Narrative   Married for 14 years.Wife Jon Washington   Lives with wife and daughter [name] age 70, and 5 dogs. All step daughters   Mesic 6 years.   Pipe fitter.   Caffeine: diet soda 8-10 a week   Right handed   Social Determinants of Health   Financial Resource Strain: Not on file  Food Insecurity: Not on file  Transportation Needs: Not on file  Physical Activity:  Not on file  Stress: Not on file  Social Connections: Not on file  Intimate Partner Violence: Not on file    ROS Review of Systems  Constitutional: Negative.  Negative for activity change, appetite change, chills, diaphoresis and fatigue.  HENT: Negative.  Negative for congestion, dental problem, drooling, ear discharge, ear pain, facial swelling, hearing loss and mouth sores.   Eyes: Negative.  Negative for pain, discharge, redness and itching.  Respiratory:  Positive for apnea. Negative for cough, choking, chest tightness, shortness of breath, wheezing and stridor.   Cardiovascular:  Negative for chest pain, palpitations and leg swelling.  Gastrointestinal:  Negative for abdominal pain, anal bleeding, blood in stool, constipation, diarrhea and nausea.  Endocrine: Negative.  Negative for cold intolerance, heat intolerance, polydipsia, polyphagia and polyuria.  Genitourinary: Negative.  Negative for difficulty urinating, dysuria, hematuria and urgency.  Musculoskeletal:  Negative for arthralgias, back pain, gait problem, joint swelling, myalgias and neck  pain.  Skin:  Negative for color change, pallor, rash and wound.  Allergic/Immunologic: Negative for environmental allergies, food allergies and immunocompromised state.  Neurological: Negative.  Negative for dizziness, seizures, facial asymmetry, speech difficulty, light-headedness, numbness and headaches.  Hematological:  Negative for adenopathy. Does not bruise/bleed easily.  Psychiatric/Behavioral:  Positive for sleep disturbance. Negative for agitation, behavioral problems, confusion, decreased concentration, dysphoric mood and suicidal ideas.    Objective:   Today's Vitals: BP (!) 162/76 (BP Location: Right Arm, Patient Position: Sitting, Cuff Size: Large)   Pulse 85   Ht '6\' 1"'  (1.854 m)   Wt 249 lb (112.9 kg)   SpO2 96%   BMI 32.85 kg/m   Physical Exam Constitutional:      General: He is not in acute distress.    Appearance: He is obese. He is not ill-appearing, toxic-appearing or diaphoretic.  HENT:     Head: Normocephalic and atraumatic.     Right Ear: External ear normal.     Left Ear: External ear normal.     Nose: No congestion or rhinorrhea.     Mouth/Throat:     Mouth: Mucous membranes are moist.     Pharynx: No oropharyngeal exudate or posterior oropharyngeal erythema.  Eyes:     General:        Right eye: No discharge.        Left eye: No discharge.     Extraocular Movements: Extraocular movements intact.     Conjunctiva/sclera: Conjunctivae normal.  Neck:     Vascular: No carotid bruit.  Cardiovascular:     Rate and Rhythm: Normal rate and regular rhythm.     Pulses: Normal pulses.     Heart sounds: Normal heart sounds. No murmur heard.   No friction rub. No gallop.  Pulmonary:     Effort: No respiratory distress.     Breath sounds: No stridor. No wheezing, rhonchi or rales.  Chest:     Chest wall: No tenderness.  Abdominal:     General: There is no distension.     Palpations: Abdomen is soft. There is no mass.     Tenderness: There is no abdominal  tenderness. There is no right CVA tenderness, left CVA tenderness, guarding or rebound.     Hernia: No hernia is present.  Musculoskeletal:        General: No swelling, tenderness, deformity or signs of injury.     Cervical back: Normal range of motion and neck supple. No rigidity or tenderness.     Right lower leg: No  edema.     Left lower leg: No edema.  Lymphadenopathy:     Cervical: No cervical adenopathy.  Skin:    General: Skin is warm.     Capillary Refill: Capillary refill takes less than 2 seconds.     Coloration: Skin is not jaundiced or pale.     Findings: No bruising, erythema, lesion or rash.  Neurological:     General: No focal deficit present.     Mental Status: He is alert and oriented to person, place, and time.     Cranial Nerves: No cranial nerve deficit.     Sensory: No sensory deficit.     Motor: No weakness.     Coordination: Coordination normal.     Gait: Gait normal.  Psychiatric:        Mood and Affect: Mood normal.        Behavior: Behavior normal.        Thought Content: Thought content normal.        Judgment: Judgment normal.    Assessment & Plan:   Problem List Items Addressed This Visit       Endocrine   Type 2 diabetes mellitus without complications ([employer])   Relevant Medications   blood glucose meter kit and supplies KIT   Other Relevant Orders   CBC   Lipid Profile   CMP14+EGFR   TSH   HgB A1c   Microalbumin, urine     Other   Elevated blood pressure reading   Relevant Medications   Blood Pressure Monitor DEVI   Other Relevant Orders   EKG 12-Lead (Completed)   Vitamin D deficiency - Primary   Relevant Orders   Vitamin D (25 hydroxy)   HgB A1c   Other Visit Diagnoses     Screening due       Relevant Orders   Hepatitis C antibody   Ambulatory referral to Gastroenterology   HIV antibody (with reflex)       Outpatient Encounter Medications as of 03/22/2021  Medication Sig   blood glucose meter kit and supplies KIT  Dispense based on patient and insurance preference. Check Blood Sugar Once daily   Blood Pressure Monitor DEVI 1 kit by Does not apply route daily.   Cholecalciferol 50 MCG (2000 UT) CHEW Chew 2,000 Int'l Units by mouth daily.   No facility-administered encounter medications on file as of 03/22/2021.    Follow-up: Return in about 4 weeks (around 04/19/2021).   Renee Rival, FNP

## 2021-03-22 NOTE — Assessment & Plan Note (Signed)
Has obstructive sleep apnea. Recently had a sleep study. Will get his CPAP

## 2021-03-22 NOTE — Assessment & Plan Note (Signed)
Lab Results  Component Value Date   HGBA1C 6.8 (H) 05/27/2020  Pt currently not taking meds.. Check A1C today and start on medication iImportance of carb modified diet and exercise discussed with PT. Foot exam done today. Will schedule eye exam.

## 2021-03-22 NOTE — Assessment & Plan Note (Signed)
Labs ordered today

## 2021-03-22 NOTE — Assessment & Plan Note (Signed)
lipid panel obtained today.  Importance of Eating  a healthy diet, including lots of fruits and vegetables discussed Avoid foods with a lot of saturated and trans fats, such as red meat, butter, fried foods and cheese . Maintain a healthy weight.

## 2021-03-23 ENCOUNTER — Other Ambulatory Visit: Payer: Self-pay | Admitting: Nurse Practitioner

## 2021-03-23 ENCOUNTER — Encounter: Payer: Self-pay | Admitting: Internal Medicine

## 2021-03-23 DIAGNOSIS — E559 Vitamin D deficiency, unspecified: Secondary | ICD-10-CM

## 2021-03-23 DIAGNOSIS — E785 Hyperlipidemia, unspecified: Secondary | ICD-10-CM

## 2021-03-23 DIAGNOSIS — E119 Type 2 diabetes mellitus without complications: Secondary | ICD-10-CM

## 2021-03-23 LAB — CMP14+EGFR
ALT: 14 IU/L (ref 0–44)
AST: 17 IU/L (ref 0–40)
Albumin/Globulin Ratio: 1.5 (ref 1.2–2.2)
Albumin: 4.2 g/dL (ref 3.8–4.9)
Alkaline Phosphatase: 82 IU/L (ref 44–121)
BUN/Creatinine Ratio: 12 (ref 9–20)
BUN: 10 mg/dL (ref 6–24)
Bilirubin Total: <0.2 mg/dL (ref 0.0–1.2)
CO2: 22 mmol/L (ref 20–29)
Calcium: 9.4 mg/dL (ref 8.7–10.2)
Chloride: 104 mmol/L (ref 96–106)
Creatinine, Ser: 0.82 mg/dL (ref 0.76–1.27)
Globulin, Total: 2.8 g/dL (ref 1.5–4.5)
Glucose: 118 mg/dL — ABNORMAL HIGH (ref 70–99)
Potassium: 4.4 mmol/L (ref 3.5–5.2)
Sodium: 138 mmol/L (ref 134–144)
Total Protein: 7 g/dL (ref 6.0–8.5)
eGFR: 104 mL/min/{1.73_m2} (ref >59)

## 2021-03-23 LAB — VITAMIN D 25 HYDROXY (VIT D DEFICIENCY, FRACTURES): Vit D, 25-Hydroxy: 16.9 ng/mL — ABNORMAL LOW (ref 30.0–100.0)

## 2021-03-23 LAB — HIV ANTIBODY (ROUTINE TESTING W REFLEX): HIV Screen 4th Generation wRfx: NONREACTIVE

## 2021-03-23 LAB — HEPATITIS C ANTIBODY: Hep C Virus Ab: <0.1 s/co ratio (ref 0.0–0.9)

## 2021-03-23 LAB — CBC
Hematocrit: 45.5 % (ref 37.5–51.0)
Hemoglobin: 15.4 g/dL (ref 13.0–17.7)
MCH: 29.4 pg (ref 26.6–33.0)
MCHC: 33.8 g/dL (ref 31.5–35.7)
MCV: 87 fL (ref 79–97)
Platelets: 249 10*3/uL (ref 150–450)
RBC: 5.23 x10E6/uL (ref 4.14–5.80)
RDW: 12.3 % (ref 11.6–15.4)
WBC: 8 10*3/uL (ref 3.4–10.8)

## 2021-03-23 LAB — LIPID PANEL
Chol/HDL Ratio: 4.6 ratio (ref 0.0–5.0)
Cholesterol, Total: 214 mg/dL — ABNORMAL HIGH (ref 100–199)
HDL: 47 mg/dL (ref >39)
LDL Chol Calc (NIH): 146 mg/dL — ABNORMAL HIGH (ref 0–99)
Triglycerides: 119 mg/dL (ref 0–149)
VLDL Cholesterol Cal: 21 mg/dL (ref 5–40)

## 2021-03-23 LAB — HEMOGLOBIN A1C
Est. average glucose Bld gHb Est-mCnc: 157 mg/dL
Hgb A1c MFr Bld: 7.1 % — ABNORMAL HIGH (ref 4.8–5.6)

## 2021-03-23 LAB — TSH: TSH: 1.31 u[IU]/mL (ref 0.450–4.500)

## 2021-03-23 MED ORDER — ATORVASTATIN CALCIUM 10 MG PO TABS: 10.0000 mg | ORAL_TABLET | Freq: Every day | ORAL | 3 refills | Status: DC

## 2021-03-23 MED ORDER — METFORMIN HCL ER 500 MG PO TB24: 500.0000 mg | ORAL_TABLET | Freq: Every day | ORAL | 3 refills | Status: DC

## 2021-03-23 MED ORDER — VITAMIN D (ERGOCALCIFEROL) 1.25 MG (50000 UNIT) PO CAPS: 50000.0000 [IU] | ORAL_CAPSULE | ORAL | 0 refills | Status: DC

## 2021-03-24 LAB — MICROALBUMIN, URINE: Microalbumin, Urine: 5.2 ug/mL

## 2021-04-04 ENCOUNTER — Ambulatory Visit: Payer: 59 | Admitting: Neurology

## 2021-04-19 ENCOUNTER — Ambulatory Visit: Payer: 59 | Admitting: Nurse Practitioner

## 2021-04-21 ENCOUNTER — Ambulatory Visit: Payer: 59 | Admitting: Nurse Practitioner

## 2021-05-02 ENCOUNTER — Ambulatory Visit (INDEPENDENT_AMBULATORY_CARE_PROVIDER_SITE_OTHER): Payer: Self-pay | Admitting: Nurse Practitioner

## 2021-05-02 ENCOUNTER — Encounter: Payer: Self-pay | Admitting: Nurse Practitioner

## 2021-05-02 ENCOUNTER — Other Ambulatory Visit: Payer: Self-pay

## 2021-05-02 VITALS — BP 138/84 | HR 98 | Ht 73.0 in | Wt 246.0 lb

## 2021-05-02 DIAGNOSIS — E119 Type 2 diabetes mellitus without complications: Secondary | ICD-10-CM

## 2021-05-02 DIAGNOSIS — E1159 Type 2 diabetes mellitus with other circulatory complications: Secondary | ICD-10-CM | POA: Insufficient documentation

## 2021-05-02 DIAGNOSIS — G4733 Obstructive sleep apnea (adult) (pediatric): Secondary | ICD-10-CM

## 2021-05-02 DIAGNOSIS — I152 Hypertension secondary to endocrine disorders: Secondary | ICD-10-CM | POA: Insufficient documentation

## 2021-05-02 DIAGNOSIS — E785 Hyperlipidemia, unspecified: Secondary | ICD-10-CM

## 2021-05-02 DIAGNOSIS — I10 Essential (primary) hypertension: Secondary | ICD-10-CM | POA: Insufficient documentation

## 2021-05-02 DIAGNOSIS — E559 Vitamin D deficiency, unspecified: Secondary | ICD-10-CM

## 2021-05-02 MED ORDER — VITAMIN D (ERGOCALCIFEROL) 1.25 MG (50000 UNIT) PO CAPS: 50000.0000 [IU] | ORAL_CAPSULE | ORAL | 0 refills | Status: AC

## 2021-05-02 MED ORDER — LISINOPRIL 10 MG PO TABS: 10.0000 mg | ORAL_TABLET | Freq: Every day | ORAL | 3 refills | Status: DC

## 2021-05-02 MED ORDER — LISINOPRIL 5 MG PO TABS: 5.0000 mg | ORAL_TABLET | Freq: Every day | ORAL | 3 refills | Status: DC

## 2021-05-02 NOTE — Patient Instructions (Addendum)
Pease get your labs done 3-5 days before next appointment.   Take atorvastatin 10mg  for hyperlipidemia. Take lisinopril 5mg  daily for your blood pressure. Take metformin 500mg  once daily for your diabetes. Take vitamin d 50,000 units once weekly for 8 weeks, after 8 weeks start taking 1000 units daily.   It is important that you exercise regularly at least 30 minutes 5 times a week.  Think about what you will eat, plan ahead. Choose " clean, green, fresh or frozen" over canned, processed or packaged foods which are more sugary, salty and fatty. 70 to 75% of food eaten should be vegetables and fruit. Three meals at set times with snacks allowed between meals, but they must be fruit or vegetables. Aim to eat over a 12 hour period , example 7 am to 7 pm, and STOP after  your last meal of the day. Drink water,generally about 64 ounces per day, no other drink is as healthy. Fruit juice is best enjoyed in a healthy way, by EATING the fruit.  Thanks for choosing [facility], we consider it a privelige to serve you.

## 2021-05-02 NOTE — Assessment & Plan Note (Signed)
Take vitamin d 50000 units once weekly for 8 weeks, then 1000 units afterwards.

## 2021-05-02 NOTE — Assessment & Plan Note (Signed)
Start lipitor 10mg  daily, recheck labs in 6 weeks.  Avoid foods that contain a lot of saturated and trans fat such as red meat, butter, fried foods and cheese. Eat a healthy diet,including lots of fruits and vegetables.

## 2021-05-02 NOTE — Assessment & Plan Note (Signed)
DASH diet and commitment to daily physical activity for a minimum of 30 minutes discussed and encouraged, as a part of hypertension management. The importance of attaining a healthy weight is also discussed.  BP/Weight 05/02/2021 03/22/2021 11/10/2020 08/25/2020 06/18/2020 06/14/2020 05/27/2020  Systolic BP 138 162 133 138 141 160 138  Diastolic BP 84 76 83 76 107 80 78  Wt. (Lbs) 246 249 247 248 254 254.4 250.4  BMI 32.46 32.85 32.59 32.72 33.51 33.56 33.04   Start lisinopril 5mg  daily, monitor BP at home.

## 2021-05-02 NOTE — Assessment & Plan Note (Signed)
DASH diet and commitment to daily physical activity for a minimum of 30 minutes discussed and encouraged, as a part of hypertension management. The importance of attaining a healthy weight is also discussed.  BP/Weight 05/02/2021 03/22/2021 11/10/2020 08/25/2020 06/18/2020 06/14/2020 05/27/2020  Systolic BP 138 162 133 138 141 160 138  Diastolic BP 84 76 83 76 107 80 78  Wt. (Lbs) 246 249 247 248 254 254.4 250.4  BMI 32.46 32.85 32.59 32.72 33.51 33.56 33.04   Start lisinopril 5mg  daily.

## 2021-05-02 NOTE — Assessment & Plan Note (Addendum)
Lab Results  Component Value Date   HGBA1C 7.1 (H) 03/22/2021  start metformin 500mg  once daily, increase to 1000mg  once daily after a week if tolerating med well. Plan discussed with pt. On lisinopril 5mg  daily Atorvastatin 10mg  daiy. Recheck A1C in 3 months.

## 2021-05-02 NOTE — Assessment & Plan Note (Signed)
He has been using his CPAP.

## 2021-05-02 NOTE — Progress Notes (Signed)
° °  Jon Washington     MRN: [id number]      DOB: 1965/09/19   HPI Mr. Chaikin is here for follow up and re-evaluation of chronic medical conditions, medication management and review of any available recent lab and radiology data.  Preventive health is updated, specifically  Cancer screening and Immunization.   Questions or concerns regarding consultations or procedures which the PT has had in the interim are  addressed. The PT denies any adverse reactions to current medications since the last visit.  There are no new concerns.  There are no specific complaints   Pt has not been taking all medications ordered, he stated that he was not aware that meds were ordered. BP is still elevated today. Pt told to start taking medications.     ROS Denies recent fever or chills. Denies sinus pressure, nasal congestion, ear pain or sore throat. Denies chest congestion, productive cough or wheezing. Denies chest pains, palpitations and leg swelling Denies abdominal pain, nausea, vomiting,diarrhea or constipation.   Denies dysuria, frequency, hesitancy or incontinence. Denies joint pain, swelling and limitation in mobility. Denies headaches, seizures, numbness, or tingling. Denies depression, anxiety or insomnia. Denies skin break down or rash.   PE  BP 138/84 (BP Location: Right Arm, Cuff Size: Normal)    Pulse 98    Ht 6\' 1"  (1.854 m)    Wt 246 lb (111.6 kg)    SpO2 94%    BMI 32.46 kg/m   Patient alert and oriented and in no cardiopulmonary distress.  HEENT: No facial asymmetry, EOMI,     Neck supple .  Chest: Clear to auscultation bilaterally.  CVS: S1, S2 no murmurs, no S3.Regular rate.  ABD: Soft non tender.   Ext: No edema  MS: Adequate ROM spine, shoulders, hips and knees.  Skin: Intact, no ulcerations or rash noted.  Psych: Good eye contact, normal affect. Memory intact not anxious or depressed appearing.  CNS: CN 2-12 intact, power,  normal throughout.no focal  deficits noted.   Assessment & Plan

## 2021-05-04 ENCOUNTER — Telehealth: Payer: Self-pay | Admitting: Nurse Practitioner

## 2021-05-04 ENCOUNTER — Other Ambulatory Visit: Payer: Self-pay | Admitting: *Deleted

## 2021-05-04 DIAGNOSIS — E119 Type 2 diabetes mellitus without complications: Secondary | ICD-10-CM

## 2021-05-04 DIAGNOSIS — E785 Hyperlipidemia, unspecified: Secondary | ICD-10-CM

## 2021-05-04 MED ORDER — METFORMIN HCL ER 500 MG PO TB24: 500.0000 mg | ORAL_TABLET | Freq: Every day | ORAL | 3 refills | Status: DC

## 2021-05-04 MED ORDER — ATORVASTATIN CALCIUM 10 MG PO TABS: 10.0000 mg | ORAL_TABLET | Freq: Every day | ORAL | 3 refills | Status: DC

## 2021-05-04 NOTE — Telephone Encounter (Signed)
Medications have been sent into pharmacy 

## 2021-05-04 NOTE — Telephone Encounter (Signed)
Pt needs refills on   atorvastatin (LIPITOR) 10 MG tablet   metFORMIN (GLUCOPHAGE XR) 500 MG 24 hr tablet  Pt never picked meds up when prescribed

## 2021-05-10 ENCOUNTER — Ambulatory Visit: Payer: 59

## 2021-05-10 ENCOUNTER — Other Ambulatory Visit: Payer: Self-pay

## 2021-05-11 ENCOUNTER — Other Ambulatory Visit: Payer: Self-pay

## 2021-05-11 ENCOUNTER — Ambulatory Visit: Payer: Self-pay

## 2021-05-16 ENCOUNTER — Ambulatory Visit (INDEPENDENT_AMBULATORY_CARE_PROVIDER_SITE_OTHER): Payer: Self-pay | Admitting: *Deleted

## 2021-05-16 ENCOUNTER — Other Ambulatory Visit: Payer: Self-pay

## 2021-05-16 VITALS — Ht 73.0 in | Wt 237.0 lb

## 2021-05-16 DIAGNOSIS — Z1211 Encounter for screening for malignant neoplasm of colon: Secondary | ICD-10-CM

## 2021-05-16 NOTE — Progress Notes (Addendum)
Gastroenterology Pre-Procedure Review  Request Date: 05/16/2021 Requesting Physician: Vena Rua, FNP @ [facility], no previous TCS  PATIENT REVIEW QUESTIONS: The patient responded to the following health history questions as indicated:    1. Diabetes Melitis: yes, type II  2. Joint replacements in the past 12 months: no 3. Major health problems in the past 3 months: no 4. Has an artificial valve or MVP: no 5. Has a defibrillator: no 6. Has been advised in past to take antibiotics in advance of a procedure like teeth cleaning: no 7. Family history of colon cancer: no 8. Alcohol Use: no 9. Illicit drug Use: no 10. History of sleep apnea: yes, CPAP  11. History of coronary artery or other vascular stents placed within the last 12 months: no 12. History of any prior anesthesia complications: no 13. Body mass index is 31.27 kg/m.    MEDICATIONS & ALLERGIES:    Patient reports the following regarding taking any blood thinners:   Plavix? no Aspirin? no Coumadin? no Brilinta? no Xarelto? no Eliquis? no Pradaxa? no Savaysa? no Effient? no  Patient confirms/reports the following medications:  Current Outpatient Medications  Medication Sig Dispense Refill   atorvastatin (LIPITOR) 10 MG tablet Take 1 tablet (10 mg total) by mouth daily. 90 tablet 3   lisinopril (ZESTRIL) 5 MG tablet Take 1 tablet (5 mg total) by mouth daily. 90 tablet 3   metFORMIN (GLUCOPHAGE XR) 500 MG 24 hr tablet Take 1 tablet (500 mg total) by mouth daily with breakfast. 30 tablet 3   Vitamin D, Ergocalciferol, (DRISDOL) 1.25 MG (50000 UNIT) CAPS capsule Take 1 capsule (50,000 Units total) by mouth every 7 (seven) days for 32 doses. 8 capsule 0   No current facility-administered medications for this visit.    Patient confirms/reports the following allergies:  No Known Allergies  No orders of the defined types were placed in this encounter.   AUTHORIZATION INFORMATION Primary Insurance:  [employer] [facility],  ID #[id number],  Group #: [id number] Pre-Cert / Auth required:  Pre-Cert / Auth #:   SCHEDULE INFORMATION: Procedure has been scheduled as follows:  Date: 06/07/2021, Time: 10:00 Location: [facility] with Dr. Abbey Chatters  This Gastroenterology Pre-Precedure Review Form is being routed to the following provider(s): Roseanne Kaufman, NP

## 2021-05-17 NOTE — Progress Notes (Signed)
ASA 2. No oral diabetes medication day of procedure.  

## 2021-05-17 NOTE — Progress Notes (Signed)
Lmom for pt to call me back. 

## 2021-05-19 ENCOUNTER — Encounter: Payer: Self-pay | Admitting: *Deleted

## 2021-05-19 MED ORDER — NA SULFATE-K SULFATE-MG SULF 17.5-3.13-1.6 GM/177ML PO SOLN: 1.0000 | Freq: Once | ORAL | 0 refills | Status: AC

## 2021-05-19 NOTE — Progress Notes (Signed)
Spoke to pt.  Scheduled procedure for 06/07/2021 with arrival at 8:30.  Reviewed prep instructions with pt by telephone.  Confirmed mailing address with pt.

## 2021-05-19 NOTE — Addendum Note (Signed)
Addended by: Noreene Larsson on: 05/19/2021 10:22 AM   Modules accepted: Orders

## 2021-05-20 NOTE — Progress Notes (Signed)
Called Jon Washington at [employer] [phone]).  Started pre-cert process.  It is pending awaiting clinicals.  Faxed to [phone].  She informed me it could take 3 business days.  REF#: [id number].    She informed me that Dr. Marletta Lor is not credentialed.  She gave me the credentialing department's information.  Passed along to Reba to see my next step.

## 2021-05-23 ENCOUNTER — Telehealth: Payer: Self-pay | Admitting: Internal Medicine

## 2021-05-23 ENCOUNTER — Encounter: Payer: Self-pay | Admitting: *Deleted

## 2021-05-23 NOTE — Telephone Encounter (Signed)
Spoke with pt.  He informed me that he has a job interview on 2/7 so requested to reschedule procedure.  Rescheduled procedure to 06/21/2021 with arrival at 8:30.  Pt made aware that I am mailing out new prep instructions.  Called and left voice message for [place] in Endo.

## 2021-05-23 NOTE — Progress Notes (Addendum)
Joni Reining from [employer] called back to let me know that we are not in network.  I informed her that Dr. Marletta Lor was not credentialed in there system per Lynnea Ferrier (discussion from 05/20/2021).  Joni Reining informed me that [facility] is not in there network either.  She is sending [place] and the pt a letter stating this.  Called pt and informed pt.  He requested to cancel procedure.  Called and [facility] for [place] in Endo.

## 2021-05-23 NOTE — Telephone Encounter (Signed)
PLEASE CALL PATIENT HE HAS TO RESCHEDULE HIS PROCEDURE

## 2021-05-24 NOTE — Progress Notes (Signed)
Joni Reining from [employer] called today to inform me that they can't find a local GI doctor in pt's network so they are going to try to get approval for pt to have procedure with [place].  Awaiting fax from them in the next couple of days.

## 2021-06-01 NOTE — Progress Notes (Signed)
Received fax from [facility] that procedure has been authorized 06/07/2021-07/05/2021.  Auth #[id number].  Sent to be scanned into Epic.

## 2021-06-13 ENCOUNTER — Telehealth: Payer: Self-pay | Admitting: Nurse Practitioner

## 2021-06-17 ENCOUNTER — Telehealth: Payer: Self-pay | Admitting: Internal Medicine

## 2021-06-17 ENCOUNTER — Telehealth: Payer: Self-pay | Admitting: *Deleted

## 2021-06-17 NOTE — Telephone Encounter (Signed)
Pt needs to reschedule his procedure that's scheduled with Dr Marletta Lor on 06/21/2021 due to insurance changing. [phone]

## 2021-06-20 ENCOUNTER — Encounter: Payer: Self-pay | Admitting: *Deleted

## 2021-06-20 NOTE — Telephone Encounter (Signed)
Spoke to pt.  Rescheduled procedure to 07/19/2021 with arrival at 8:00 at [facility].  Pt said that he is rescheduling because his amount of cost owed after insurance was going to be $4080.00.  He can not afford this so he is getting new insurance starting March 1.  Pt made aware that I am mailing out new instructions.  LMOM for [place] in Endo.

## 2021-06-21 ENCOUNTER — Ambulatory Visit (HOSPITAL_COMMUNITY): Admit: 2021-06-21 | Payer: PRIVATE HEALTH INSURANCE

## 2021-06-21 ENCOUNTER — Encounter (HOSPITAL_COMMUNITY): Payer: Self-pay

## 2021-06-21 SURGERY — COLONOSCOPY WITH PROPOFOL
Anesthesia: Monitor Anesthesia Care

## 2021-06-28 ENCOUNTER — Ambulatory Visit: Payer: Self-pay | Admitting: Neurology

## 2021-06-28 ENCOUNTER — Encounter: Payer: Self-pay | Admitting: Neurology

## 2021-06-29 NOTE — Telephone Encounter (Signed)
Spoke to pt.  Asked him if he received new insurance yet.  He said he should be getting it through the mail any day now.  Pt informed to call me once he receives it.  Explained to pt the importance of seeing if pre-cert is required. He voiced understanding and assured me that he would call me once he receives it.   ?

## 2021-07-06 ENCOUNTER — Telehealth: Payer: Self-pay | Admitting: Nurse Practitioner

## 2021-07-08 ENCOUNTER — Other Ambulatory Visit: Payer: Self-pay | Admitting: *Deleted

## 2021-07-08 NOTE — Telephone Encounter (Signed)
Spoke to pt.  He said that he has [employer] now.  ID#: [id number] Group: [id number].  Called Pre-Cert dept at [phone].  Spoke to [employer] at [employer].  No pre-cert required.  REF#: [id number] ?

## 2021-07-15 ENCOUNTER — Ambulatory Visit (INDEPENDENT_AMBULATORY_CARE_PROVIDER_SITE_OTHER): Payer: 59 | Admitting: Nurse Practitioner

## 2021-07-15 ENCOUNTER — Other Ambulatory Visit: Payer: Self-pay

## 2021-07-15 ENCOUNTER — Encounter: Payer: Self-pay | Admitting: Nurse Practitioner

## 2021-07-15 DIAGNOSIS — E119 Type 2 diabetes mellitus without complications: Secondary | ICD-10-CM

## 2021-07-15 DIAGNOSIS — E1159 Type 2 diabetes mellitus with other circulatory complications: Secondary | ICD-10-CM

## 2021-07-15 DIAGNOSIS — I152 Hypertension secondary to endocrine disorders: Secondary | ICD-10-CM

## 2021-07-15 DIAGNOSIS — E785 Hyperlipidemia, unspecified: Secondary | ICD-10-CM

## 2021-07-15 NOTE — Assessment & Plan Note (Signed)
Patient states that he has been taking lisinopril 5 mg daily ?Patient denies any side effects from medications. ?Patient we get labs done in 3 days to check his CMP plus EGFR ?

## 2021-07-15 NOTE — Assessment & Plan Note (Addendum)
Takes atorvastatin 10 mg daily, ?Goal is LDL less than 70 ?Patient will get his lipid panel checked in 3 days ?Denied side effects from medication. ? ? ?

## 2021-07-15 NOTE — Assessment & Plan Note (Signed)
Takes metformin 500 mg daily, tolerating medication well.  Patient told to start taking metformin 1000 mg once daily.  Will check A1c in 1 month.  Avoid sugar sweets. ?

## 2021-07-15 NOTE — Progress Notes (Signed)
Virtual Visit via Telephone Note ? ?I connected with Jon Washington on 07/15/21 at 4:25pm by telephone and verified that I am speaking with the correct person using two identifiers.  I spent 13 minutes on this telephone encounter.  ? ?Location: ?Patient: home ?Provider: office ?  ?I discussed the limitations, risks, security and privacy concerns of performing an evaluation and management service by telephone and the availability of in person appointments. I also discussed with the patient that there may be a patient responsible charge related to this service. The patient expressed understanding and agreed to proceed. ? ? ?History of Present Illness: ?Patient weight history of type 2 diabetes, hypertension, obesity presents for follow-up for hypertension and hyperlipidemia .  Patient states that he has been taking all medications as prescribed he has not been checking his BP at home.  Patient denies chest pain, dizziness, edema.  ?  ?Observations/Objective: ? ? ?Assessment and Plan: ?Hypertension.  ?Patient states he has been taking lisinopril 5 mg daily but he has not been checking his blood pressure ?Continue lisinopril 5 mg daily ?Patient encouraged to get a BP monitor and check his blood pressure daily goal is for blood pressure to be less 130/80 ?Avoid salt engage in regular vigorous exercise. ?Check CMP plus EGFR in 3 days ? ?Hyperlipidemia ?Takes atorvastatin 10 mg daily, ?Goal is LDL less than 70 ?Patient will get his lipid panel checked in 3 days ?Denied side effects from medication. ? ? ?Type 2 diabetes mellitus without complication ?Takes metformin 500 mg daily, tolerating medication well.  Patient told to start taking metformin 1000 mg once daily.  Will check A1c in 1 month.  Avoid sugar sweets. ? ? ? ?Follow Up Instructions: ? ?  ?I discussed the assessment and treatment plan with the patient. The patient was provided an opportunity to ask questions and all were answered. The patient agreed with  the plan and demonstrated an understanding of the instructions. ?  ?The patient was advised to call back or seek an in-person evaluation if the symptoms worsen or if the condition fails to improve as anticipated.  ?

## 2021-07-18 ENCOUNTER — Telehealth: Payer: Self-pay | Admitting: Internal Medicine

## 2021-07-18 NOTE — Telephone Encounter (Signed)
Melanie form endo call. Pt needs to r/s procedure for tomorrow bc his hand hurts ?

## 2021-07-18 NOTE — Telephone Encounter (Signed)
Pt called to cancel his procedure with Dr Marletta Lor for tomorrow. [phone] ?

## 2021-07-19 ENCOUNTER — Encounter (HOSPITAL_COMMUNITY): Admission: RE | Payer: Self-pay | Source: Home / Self Care

## 2021-07-19 ENCOUNTER — Ambulatory Visit (HOSPITAL_COMMUNITY): Admission: RE | Admit: 2021-07-19 | Payer: 59 | Source: Home / Self Care

## 2021-07-19 SURGERY — COLONOSCOPY WITH PROPOFOL
Anesthesia: Monitor Anesthesia Care

## 2021-07-19 NOTE — Telephone Encounter (Signed)
Spoke to pt.  He said he has to get hand surgery and would like to hold off on rescheduling for now.  Pt said he will call us back when he is ready to reschedule. ?

## 2021-07-25 ENCOUNTER — Ambulatory Visit: Payer: 59 | Admitting: Orthopedic Surgery

## 2021-07-29 ENCOUNTER — Ambulatory Visit: Payer: 59

## 2021-07-29 ENCOUNTER — Ambulatory Visit (INDEPENDENT_AMBULATORY_CARE_PROVIDER_SITE_OTHER): Payer: 59 | Admitting: Orthopedic Surgery

## 2021-07-29 ENCOUNTER — Encounter: Payer: Self-pay | Admitting: Orthopedic Surgery

## 2021-07-29 VITALS — BP 146/95 | HR 89 | Ht 73.0 in | Wt 246.0 lb

## 2021-07-29 DIAGNOSIS — M79642 Pain in left hand: Secondary | ICD-10-CM | POA: Diagnosis not present

## 2021-07-29 DIAGNOSIS — M79641 Pain in right hand: Secondary | ICD-10-CM

## 2021-07-29 DIAGNOSIS — M67449 Ganglion, unspecified hand: Secondary | ICD-10-CM

## 2021-07-29 DIAGNOSIS — M67441 Ganglion, right hand: Secondary | ICD-10-CM | POA: Diagnosis not present

## 2021-07-29 NOTE — Progress Notes (Signed)
Return Patient Visit - New Problem ? ?Assessment: ?Jon Washington is a 56 y.o. male with the following: ?1. Pain in left hand ?2. Pain in right hand ?3. Ganglion cyst of finger ? ?Plan: ?Jon Washington has diffuse pain in bilateral hands.  Specifically, he has a cyst on the right thumb, and notes some stiffness around the PIP joint of the long finger.  I believe he has developed some stiffness in the PIP joint.  Otherwise, he has diffuse degenerative changes in the PIP and DIP joints.  This is not severe, as he has loss of joint space, with minimal osteophytes.  Regarding the cyst, we discussed multiple treatment options, including continued observation, decompression of the cyst and possible surgery.  He would like to continue monitoring.  He was pleased that this is not a larger issue, as I provided him reassurance.  In his left hand, he has diffuse occasional pain.  He also has aching pain within bilateral thenar eminence.  Similar findings on x-ray of the left hand, compared to the right.  Nothing further needed at this time.  Follow-up as needed. ? ?Follow-up: ?Return if symptoms worsen or fail to improve. ? ?Subjective: ? ?Chief Complaint  ?Patient presents with  ? Hand Pain  ?  Bilat hand pains for years, concerned about nodules in rt thumb  ? ? ?History of Present Illness: ?Jon Washington is a 56 y.o. male who presents for evaluation of bilateral hand pain.  I previously seen him in clinic for right shoulder pain.  He has done well, following an injection.  Regarding his right hand, he has noticed growth on the dorsal aspect of the IP joint.  He also has some stiffness in his long finger.  He is not taking medications for these issues.  His health has remained stable.  He has diffuse minor aches and pains in his left hand.  Bilaterally, he is also complaining of pain within the thenar eminence.  No specific injury.  He is a Scientist, product/process development, and uses hands for manual labor. ? ? ?Review of  Systems: ?No fevers or chills ?No numbness or tingling ?No chest pain ?No shortness of breath ?No bowel or bladder dysfunction ?No GI distress ?No headaches ? ? ?Objective: ?BP (!) 146/95   Pulse 89   Ht 6\' 1"  (1.854 m)   Wt 246 lb (111.6 kg)   BMI 32.46 kg/m?  ? ?Physical Exam: ? ?General: Alert and oriented. and No acute distress. ?Gait: Normal gait. ? ?Right hand without obvious deformity.  Some stiffness is appreciated at the PIP joint of the long finger.  He notes some pain in this finger with full extension, as well as full flexion.  No instability at the PIP joint.  On his left thumb, at the dorsal aspect of the IP joint, he has a soft growth, which is mobile.  No overlying skin changes.  This is not tender to palpation.  It is approximately 0.5 cm.  Intact sensation within the median nerve distribution.  Negative Tinel's bilaterally. ? ?IMAGING: ?I personally ordered and reviewed the following images ? ?X-rays of bilateral hands are without acute injury.  He has diffuse loss of joint space at the PIP and DIP joints to all fingers.  The IP joint, specifically on the right thumb demonstrates asymmetric loss of joint space.  Minimal osteophytes are noted.  No injuries within the carpal bones or the wrist joint. ? ?Impression: Bilateral hands with diffuse distal arthritis, mild overall without significant osteophytes. ? ? ?  New Medications:  ?No orders of the defined types were placed in this encounter. ? ? ? ? ?Jon Rasmussen, MD ? ?07/29/2021 ?9:46 AM ? ? ?

## 2021-09-02 ENCOUNTER — Ambulatory Visit: Payer: 59 | Admitting: Nurse Practitioner

## 2022-03-16 ENCOUNTER — Ambulatory Visit (INDEPENDENT_AMBULATORY_CARE_PROVIDER_SITE_OTHER): Payer: Managed Care, Other (non HMO) | Admitting: Orthopaedic Surgery

## 2022-03-16 ENCOUNTER — Encounter: Payer: Self-pay | Admitting: Orthopaedic Surgery

## 2022-03-16 DIAGNOSIS — M19011 Primary osteoarthritis, right shoulder: Secondary | ICD-10-CM

## 2022-03-16 MED ORDER — METHYLPREDNISOLONE ACETATE 40 MG/ML IJ SUSP
40.0000 mg | Freq: Once | INTRAMUSCULAR | Status: AC
  Administered 2022-03-16: 40 mg via INTRA_ARTICULAR

## 2022-03-16 NOTE — Progress Notes (Signed)
PROCEDURE NOTE:  The patient request injection, verbal consent was obtained.  The right shoulder was prepped appropriately after time out was performed.   Sterile technique was observed and injection of 1 cc of DepoMedrol 40mg  with several cc's of plain xylocaine. Anesthesia was provided by ethyl chloride and a 20-gauge needle was used to inject the shoulder area. A posterior approach was used.  The injection was tolerated well.  A band aid dressing was applied.  The patient was advised to apply ice later today and tomorrow to the injection sight as needed.  Encounter Diagnosis  Name Primary?   Glenohumeral arthritis, right Yes   Return prn.  Call if any problem.  Precautions discussed.  Electronically Signed , MD 11/16/20238:18 AM

## 2022-03-16 NOTE — Addendum Note (Signed)
Addended by: Recardo Evangelist A on: 03/16/2022 10:45 AM   Modules accepted: Orders

## 2022-03-21 ENCOUNTER — Ambulatory Visit: Payer: 59 | Admitting: Orthopedic Surgery

## 2022-03-29 ENCOUNTER — Telehealth: Payer: Managed Care, Other (non HMO) | Admitting: Physician Assistant

## 2022-03-29 VITALS — Temp 98.6°F

## 2022-03-29 DIAGNOSIS — U071 COVID-19: Secondary | ICD-10-CM

## 2022-03-29 MED ORDER — BENZONATATE 100 MG PO CAPS: 100.0000 mg | ORAL_CAPSULE | Freq: Three times a day (TID) | ORAL | 0 refills | Status: DC | PRN

## 2022-03-29 MED ORDER — MOLNUPIRAVIR EUA 200MG CAPSULE
4.0000 | ORAL_CAPSULE | Freq: Two times a day (BID) | ORAL | 0 refills | Status: AC
Start: 2022-03-29 — End: 2022-04-03

## 2022-03-29 NOTE — Progress Notes (Signed)
Virtual Visit Consent   Jon Washington, you are scheduled for a virtual visit with a [facility] provider today. Just as with appointments in the office, your consent must be obtained to participate. Your consent will be active for this visit and any virtual visit you may have with one of our providers in the next 365 days. If you have a MyChart account, a copy of this consent can be sent to you electronically.  As this is a virtual visit, video technology does not allow for your provider to perform a traditional examination. This may limit your provider's ability to fully assess your condition. If your provider identifies any concerns that need to be evaluated in person or the need to arrange testing (such as labs, EKG, etc.), we will make arrangements to do so. Although advances in technology are sophisticated, we cannot ensure that it will always work on either your end or our end. If the connection with a video visit is poor, the visit may have to be switched to a telephone visit. With either a video or telephone visit, we are not always able to ensure that we have a secure connection.  By engaging in this virtual visit, you consent to the provision of healthcare and authorize for your insurance to be billed (if applicable) for the services provided during this visit. Depending on your insurance coverage, you may receive a charge related to this service.  I need to obtain your verbal consent now. Are you willing to proceed with your visit today? Jon Washington has provided verbal consent on 03/29/2022 for a virtual visit (video or telephone). Jon Washington, [place]  Date: 03/29/2022 4:15 PM  Virtual Visit via Video Note   I, Jon Washington, connected with  Jon Washington  ([id number], [birth year]) on 03/29/22 at  4:15 PM EST by a video-enabled telemedicine application and verified that I am speaking with the correct person using two identifiers.  Location: Patient:  Virtual Visit Location Patient: Home Provider: Virtual Visit Location Provider: Home Office   I discussed the limitations of evaluation and management by telemedicine and the availability of in person appointments. The patient expressed understanding and agreed to proceed.    History of Present Illness: [name] is a 56 y.o. who identifies as a male who was assigned male at birth, and is being seen today for COVID-19. Symptoms starting last night into this morning with nasal/head congestion, body aches, chills and fatigue. Very slight windedness with exertion. Denies GI symptoms. Daughter with COVID, testing positive Saturday. Took COVID test this afternoon which was positive. Has taken OTC Ibuprofen. History of HLD, Diabetes (diet controlled).   HPI: HPI  Problems:  Patient Active Problem List   Diagnosis Date Noted   Hypertension associated with diabetes (HCC) 05/02/2021   High blood pressure 05/02/2021   Obstructive sleep apnea 03/22/2021   Screening due 03/22/2021   Hypersomnia with sleep apnea 11/10/2020   Loud snoring 11/10/2020   Excessive daytime sleepiness 11/10/2020   Large tonsils 11/10/2020   Solar keratosis 06/30/2016   Type 2 diabetes mellitus without complications (HCC) 06/13/2016   HLD (hyperlipidemia) 06/13/2016   Vitamin D deficiency 06/13/2016   Family history of heart disease in male family member before age 17 06/12/2016   Obesity (BMI 30-39.9) 06/12/2016   Elevated blood pressure reading 06/12/2016   Encounter to establish care with new doctor 06/12/2016    Allergies: No Known Allergies Medications:  Current Outpatient Medications:    benzonatate (TESSALON) 100 MG capsule,  Take 1 capsule (100 mg total) by mouth 3 (three) times daily as needed for cough., Disp: 30 capsule, Rfl: 0   molnupiravir EUA (LAGEVRIO) 200 mg CAPS capsule, Take 4 capsules (800 mg total) by mouth 2 (two) times daily for 5 days., Disp: 40 capsule, Rfl:  0  Observations/Objective: Patient is well-developed, well-nourished in no acute distress.  Resting comfortably at home.  Head is normocephalic, atraumatic.  No labored breathing.  Speech is clear and coherent with logical content.  Patient is alert and oriented at baseline.   Assessment and Plan: 1. COVID-19 - MyChart COVID-19 home monitoring program; Future - benzonatate (TESSALON) 100 MG capsule; Take 1 capsule (100 mg total) by mouth 3 (three) times daily as needed for cough.  Dispense: 30 capsule; Refill: 0 - molnupiravir EUA (LAGEVRIO) 200 mg CAPS capsule; Take 4 capsules (800 mg total) by mouth 2 (two) times daily for 5 days.  Dispense: 40 capsule; Refill: 0  Patient with multiple risk factors for complicated course of illness. Discussed risks/benefits of antiviral medications including most common potential ADRs. Patient voiced understanding and would like to proceed with antiviral medication. They are candidate for Molnupiravir (no recent renal panel available to prescribe Paxlovid). Rx sent to pharmacy. Supportive measures, OTC medications and vitamin regimen reviewed. Tessalon per orders. Patient has been enrolled in a MyChart COVID symptom monitoring program. Anne Shutter reviewed in detail. Strict ER precautions discussed with patient.    Follow Up Instructions: I discussed the assessment and treatment plan with the patient. The patient was provided an opportunity to ask questions and all were answered. The patient agreed with the plan and demonstrated an understanding of the instructions.  A copy of instructions were sent to the patient via MyChart unless otherwise noted below.   The patient was advised to call back or seek an in-person evaluation if the symptoms worsen or if the condition fails to improve as anticipated.  Time:  I spent 10 minutes with the patient via telehealth technology discussing the above problems/concerns.    Jon Climes, PA-C

## 2022-03-29 NOTE — Patient Instructions (Signed)
[name], thank you for joining [name], PA-C for today's virtual visit.  While this provider is not your primary care provider (PCP), if your PCP is located in our provider database this encounter information will be shared with them immediately following your visit.   A [facility] MyChart account gives you access to today's visit and all your visits, tests, and labs performed at [facility] " click here if you don't have a [facility] MyChart account or go to [email or web address]  Consent: (Patient) Jon Washington provided verbal consent for this virtual visit at the beginning of the encounter.  Current Medications:  Current Outpatient Medications:    atorvastatin (LIPITOR) 10 MG tablet, Take 1 tablet (10 mg total) by mouth daily. (Patient not taking: Reported on 03/16/2022), Disp: 90 tablet, Rfl: 3   lisinopril (ZESTRIL) 5 MG tablet, Take 1 tablet (5 mg total) by mouth daily. (Patient not taking: Reported on 03/16/2022), Disp: 90 tablet, Rfl: 3   metFORMIN (GLUCOPHAGE XR) 500 MG 24 hr tablet, Take 1 tablet (500 mg total) by mouth daily with breakfast. (Patient not taking: Reported on 03/16/2022), Disp: 30 tablet, Rfl: 3   Medications ordered in this encounter:  No orders of the defined types were placed in this encounter.    *If you need refills on other medications prior to your next appointment, please contact your pharmacy*  Follow-Up: Call back or seek an in-person evaluation if the symptoms worsen or if the condition fails to improve as anticipated.  [facility] [phone]  Other Instructions Please keep well-hydrated and get plenty of rest. Start a saline nasal rinse to flush out your nasal passages. You can use plain Mucinex to help thin congestion. If you have a humidifier, running in the bedroom at night. I want you to start OTC vitamin D3 1000 units daily, vitamin C 1000 mg daily, and a zinc  supplement. Please take prescribed medications as directed.  You have been enrolled in a MyChart symptom monitoring program. Please answer these questions daily so we can keep track of how you are doing.  You were to quarantine for 5 days from onset of your symptoms.  After day 5, if you have had no fever and you are feeling better, you can end quarantine but need to mask for an additional 5 days. After day 5 if you have a fever or are having significant symptoms, please quarantine for full 10 days.  If you note any worsening of symptoms, any significant shortness of breath or any chest pain, please seek ER evaluation ASAP.  Please do not delay care!  COVID-19: What to Do if You Are Sick If you test positive and are an older adult or someone who is at high risk of getting very sick from COVID-19, treatment may be available. Contact a healthcare provider right away after a positive test to determine if you are eligible, even if your symptoms are mild right now. You can also visit a Test to Treat location and, if eligible, receive a prescription from a provider. Don't delay: Treatment must be started within the first few days to be effective. If you have a fever, cough, or other symptoms, you might have COVID-19. Most people have mild illness and are able to recover at home. If you are sick: Keep track of your symptoms. If you have an emergency warning sign (including trouble breathing), call 911. Steps to help prevent the spread of COVID-19 if you are sick If you are  sick with COVID-19 or think you might have COVID-19, follow the steps below to care for yourself and to help protect other people in your home and community. Stay home except to get medical care Stay home. Most people with COVID-19 have mild illness and can recover at home without medical care. Do not leave your home, except to get medical care. Do not visit public areas and do not go to places where you are unable to wear a  mask. Take care of yourself. Get rest and stay hydrated. Take over-the-counter medicines, such as acetaminophen, to help you feel better. Stay in touch with your doctor. Call before you get medical care. Be sure to get care if you have trouble breathing, or have any other emergency warning signs, or if you think it is an emergency. Avoid public transportation, ride-sharing, or taxis if possible. Get tested If you have symptoms of COVID-19, get tested. While waiting for test results, stay away from others, including staying apart from those living in your household. Get tested as soon as possible after your symptoms start. Treatments may be available for people with COVID-19 who are at risk for becoming very sick. Don't delay: Treatment must be started early to be effective--some treatments must begin within 5 days of your first symptoms. Contact your healthcare provider right away if your test result is positive to determine if you are eligible. Self-tests are one of several options for testing for the virus that causes COVID-19 and may be more convenient than laboratory-based tests and point-of-care tests. Ask your healthcare provider or your local health department if you need help interpreting your test results. You can visit your state, tribal, local, and territorial health department's website to look for the latest local information on testing sites. Separate yourself from other people As much as possible, stay in a specific room and away from other people and pets in your home. If possible, you should use a separate bathroom. If you need to be around other people or animals in or outside of the home, wear a well-fitting mask. Tell your close contacts that they may have been exposed to COVID-19. An infected person can spread COVID-19 starting 48 hours (or 2 days) before the person has any symptoms or tests positive. By letting your close contacts know they may have been exposed to COVID-19, you are  helping to protect everyone. See COVID-19 and Animals if you have questions about pets. If you are diagnosed with COVID-19, someone from the health department may call you. Answer the call to slow the spread. Monitor your symptoms Symptoms of COVID-19 include fever, cough, or other symptoms. Follow care instructions from your healthcare provider and local health department. Your local health authorities may give instructions on checking your symptoms and reporting information. When to seek emergency medical attention Look for emergency warning signs* for COVID-19. If someone is showing any of these signs, seek emergency medical care immediately: Trouble breathing Persistent pain or pressure in the chest New confusion Inability to wake or stay awake Pale, gray, or blue-colored skin, lips, or nail beds, depending on skin tone *This list is not all possible symptoms. Please call your medical provider for any other symptoms that are severe or concerning to you. Call 911 or call ahead to your local emergency facility: Notify the operator that you are seeking care for someone who has or may have COVID-19. Call ahead before visiting your doctor Call ahead. Many medical visits for routine care are being postponed or done  by phone or telemedicine. If you have a medical appointment that cannot be postponed, call your doctor's office, and tell them you have or may have COVID-19. This will help the office protect themselves and other patients. If you are sick, wear a well-fitting mask You should wear a mask if you must be around other people or animals, including pets (even at home). Wear a mask with the best fit, protection, and comfort for you. You don't need to wear the mask if you are alone. If you can't put on a mask (because of trouble breathing, for example), cover your coughs and sneezes in some other way. Try to stay at least 6 feet away from other people. This will help protect the people around  you. Masks should not be placed on young children under age 19 years, anyone who has trouble breathing, or anyone who is not able to remove the mask without help. Cover your coughs and sneezes Cover your mouth and nose with a tissue when you cough or sneeze. Throw away used tissues in a lined trash can. Immediately wash your hands with soap and water for at least 20 seconds. If soap and water are not available, clean your hands with an alcohol-based hand sanitizer that contains at least 60% alcohol. Clean your hands often Wash your hands often with soap and water for at least 20 seconds. This is especially important after blowing your nose, coughing, or sneezing; going to the bathroom; and before eating or preparing food. Use hand sanitizer if soap and water are not available. Use an alcohol-based hand sanitizer with at least 60% alcohol, covering all surfaces of your hands and rubbing them together until they feel dry. Soap and water are the best option, especially if hands are visibly dirty. Avoid touching your eyes, nose, and mouth with unwashed hands. Handwashing Tips Avoid sharing personal household items Do not share dishes, drinking glasses, cups, eating utensils, towels, or bedding with other people in your home. Wash these items thoroughly after using them with soap and water or put in the dishwasher. Clean surfaces in your home regularly Clean and disinfect high-touch surfaces (for example, doorknobs, tables, handles, light switches, and countertops) in your "sick room" and bathroom. In shared spaces, you should clean and disinfect surfaces and items after each use by the person who is ill. If you are sick and cannot clean, a caregiver or other person should only clean and disinfect the area around you (such as your bedroom and bathroom) on an as needed basis. Your caregiver/other person should wait as long as possible (at least several hours) and wear a mask before entering, cleaning, and  disinfecting shared spaces that you use. Clean and disinfect areas that may have blood, stool, or body fluids on them. Use household cleaners and disinfectants. Clean visible dirty surfaces with household cleaners containing soap or detergent. Then, use a household disinfectant. Use a product from [employer] List N: Disinfectants for Coronavirus (COVID-19). Be sure to follow the instructions on the label to ensure safe and effective use of the product. Many products recommend keeping the surface wet with a disinfectant for a certain period of time (look at "contact time" on the product label). You may also need to wear personal protective equipment, such as gloves, depending on the directions on the product label. Immediately after disinfecting, wash your hands with soap and water for 20 seconds. For completed guidance on cleaning and disinfecting your home, visit Complete Disinfection Guidance. Take steps to improve ventilation  at home Improve ventilation (air flow) at home to help prevent from spreading COVID-19 to other people in your household. Clear out COVID-19 virus particles in the air by opening windows, using air filters, and turning on fans in your home. Use this interactive tool to learn how to improve air flow in your home. When you can be around others after being sick with COVID-19 Deciding when you can be around others is different for different situations. Find out when you can safely end home isolation. For any additional questions about your care, contact your healthcare provider or state or local health department. 07/20/2020 Content source: [facility] ([facility]), Division of Viral Diseases This information is not intended to replace advice given to you by your health care provider. Make sure you discuss any questions you have with your health care provider. Document Revised: 09/02/2020 Document Reviewed: 09/02/2020 Elsevier Patient  Education  2022 [employer].   If you have been instructed to have an in-person evaluation today at a local Urgent Care facility, please use the link below. It will take you to a list of all of our available [facility], including address, phone number and hours of operation. Please do not delay care.  [facility]  If you or a family member do not have a primary care provider, use the link below to schedule a visit and establish care. When you choose a [facility] primary care physician or advanced practice provider, you gain a long-term partner in health. Find a Primary Care Provider  Learn more about [facility]'s in-office and virtual care options: [facility] - Get Care Now

## 2022-04-20 ENCOUNTER — Ambulatory Visit: Payer: Managed Care, Other (non HMO) | Admitting: Internal Medicine

## 2022-05-04 ENCOUNTER — Ambulatory Visit: Payer: Managed Care, Other (non HMO) | Admitting: Internal Medicine

## 2022-05-04 ENCOUNTER — Encounter: Payer: Self-pay | Admitting: Internal Medicine

## 2022-05-04 VITALS — BP 149/84 | HR 94 | Ht 73.0 in | Wt 248.0 lb

## 2022-05-04 DIAGNOSIS — G4733 Obstructive sleep apnea (adult) (pediatric): Secondary | ICD-10-CM | POA: Diagnosis not present

## 2022-05-04 DIAGNOSIS — E1159 Type 2 diabetes mellitus with other circulatory complications: Secondary | ICD-10-CM

## 2022-05-04 DIAGNOSIS — E785 Hyperlipidemia, unspecified: Secondary | ICD-10-CM

## 2022-05-04 DIAGNOSIS — Z0001 Encounter for general adult medical examination with abnormal findings: Secondary | ICD-10-CM

## 2022-05-04 DIAGNOSIS — I152 Hypertension secondary to endocrine disorders: Secondary | ICD-10-CM

## 2022-05-04 DIAGNOSIS — E119 Type 2 diabetes mellitus without complications: Secondary | ICD-10-CM

## 2022-05-04 DIAGNOSIS — E559 Vitamin D deficiency, unspecified: Secondary | ICD-10-CM

## 2022-05-04 NOTE — Patient Instructions (Signed)
It was a pleasure to see you today.  Thank you for giving [place] the opportunity to be involved in your care.  Below is a brief recap of your visit and next steps.  We will plan to see you again in 4 weeks.  Summary Repeat labs ordered today We will follow up in 4 weeks to review results and discuss appropriate medicines to start

## 2022-05-04 NOTE — Progress Notes (Signed)
Established Patient Office Visit  Subjective   Patient ID: Jon Washington, male    DOB: 05-05-1965  Age: 57 y.o. MRN: [id number]  Chief Complaint  Patient presents with   Follow-up    Patient hasn't seen a doctor in a year and wanted to follow up with his health    Mr. Lucena returns to care today. He was last seen at [facility] in person on 05/02/21 by Edwin Dada, NP.  There have been no acute interval events.  His past medical history is significant for hypertension, OSA, T2DM, and hyperlipidemia. Mr. Guadamuz reports feeling well today.  He is asymptomatic and has no acute concerns to discuss.  He presents today because he recognizes the need to follow-up on his current state of health.  He is not currently taking any medications on a regular basis.  Chronic medical conditions and outstanding preventative care items discussed today are individually addressed in A/P below.  Past Medical History:  Diagnosis Date   Chronic back pain    HLD (hyperlipidemia) 06/13/2016   Hypertension    Sleep apnea    Past Surgical History:  Procedure Laterality Date   APPENDECTOMY  1974   HERNIA REPAIR  1978   Social History   Tobacco Use   Smoking status: Former    Packs/day: 1.00    Types: Cigarettes    Start date: 05/01/1980    Quit date: 05/01/1997    Years since quitting: 25.0   Smokeless tobacco: Never  Vaping Use   Vaping Use: Former   Devices: started in 1982 quit in 1994  Substance Use Topics   Alcohol use: No   Drug use: No   Family History  Problem Relation Age of Onset   Thyroid disease Mother    Alcohol abuse Father    Arthritis Father    Cancer Father        lymphoma   COPD Father    Heart disease Father        irreg beat   Heart disease Sister 42       CAD   Hypertension Sister    Cancer Sister        hodgkins, breast, skin   Breast cancer Sister    Heart disease Maternal Uncle    Mental illness Maternal Grandmother        social anxiety   Heart disease  Paternal Grandfather 7       heart attack   No Known Allergies  Review of Systems  Constitutional:  Negative for chills and fever.  HENT:  Negative for sore throat.   Respiratory:  Negative for cough and shortness of breath.   Cardiovascular:  Negative for chest pain, palpitations and leg swelling.  Gastrointestinal:  Negative for abdominal pain, blood in stool, constipation, diarrhea, nausea and vomiting.  Genitourinary:  Negative for dysuria and hematuria.  Musculoskeletal:  Negative for myalgias.  Skin:  Negative for itching and rash.  Neurological:  Negative for dizziness and headaches.  Psychiatric/Behavioral:  Negative for depression and suicidal ideas.      Objective:     BP (!) 149/84 (BP Location: Right Arm, Patient Position: Sitting, Cuff Size: Large)   Pulse 94   Ht 6\' 1"  (1.854 m)   Wt 248 lb (112.5 kg)   SpO2 95%   BMI 32.72 kg/m  BP Readings from Last 3 Encounters:  05/04/22 (!) 149/84  07/29/21 (!) 146/95  05/02/21 138/84   Physical Exam Vitals reviewed.  Constitutional:  General: He is not in acute distress.    Appearance: Normal appearance. He is obese. He is not ill-appearing.  HENT:     Head: Normocephalic and atraumatic.     Right Ear: External ear normal.     Left Ear: External ear normal.     Nose: Nose normal. No congestion or rhinorrhea.     Mouth/Throat:     Mouth: Mucous membranes are moist.     Pharynx: Oropharynx is clear.  Eyes:     General: No scleral icterus.    Extraocular Movements: Extraocular movements intact.     Conjunctiva/sclera: Conjunctivae normal.     Pupils: Pupils are equal, round, and reactive to light.  Cardiovascular:     Rate and Rhythm: Normal rate and regular rhythm.     Pulses: Normal pulses.     Heart sounds: Normal heart sounds. No murmur heard. Pulmonary:     Effort: Pulmonary effort is normal.     Breath sounds: Normal breath sounds. No wheezing, rhonchi or rales.  Abdominal:     General: Abdomen is  flat. Bowel sounds are normal. There is no distension.     Palpations: Abdomen is soft.     Tenderness: There is no abdominal tenderness.  Musculoskeletal:        General: No swelling or deformity. Normal range of motion.     Cervical back: Normal range of motion.  Skin:    General: Skin is warm and dry.     Capillary Refill: Capillary refill takes less than 2 seconds.  Neurological:     General: No focal deficit present.     Mental Status: He is alert and oriented to person, place, and time.     Motor: No weakness.  Psychiatric:        Mood and Affect: Mood normal.        Behavior: Behavior normal.        Thought Content: Thought content normal.    Last CBC Lab Results  Component Value Date   WBC 8.0 03/22/2021   HGB 15.4 03/22/2021   HCT 45.5 03/22/2021   MCV 87 03/22/2021   MCH 29.4 03/22/2021   RDW 12.3 03/22/2021   PLT 249 02/22/8526   Last metabolic panel Lab Results  Component Value Date   GLUCOSE 118 (H) 03/22/2021   NA 138 03/22/2021   K 4.4 03/22/2021   CL 104 03/22/2021   CO2 22 03/22/2021   BUN 10 03/22/2021   CREATININE 0.82 03/22/2021   EGFR 104 03/22/2021   CALCIUM 9.4 03/22/2021   PROT 7.0 03/22/2021   ALBUMIN 4.2 03/22/2021   LABGLOB 2.8 03/22/2021   AGRATIO 1.5 03/22/2021   BILITOT <0.2 03/22/2021   ALKPHOS 82 03/22/2021   AST 17 03/22/2021   ALT 14 03/22/2021   Last lipids Lab Results  Component Value Date   CHOL 214 (H) 03/22/2021   HDL 47 03/22/2021   LDLCALC 146 (H) 03/22/2021   TRIG 119 03/22/2021   CHOLHDL 4.6 03/22/2021   Last hemoglobin A1c Lab Results  Component Value Date   HGBA1C 7.1 (H) 03/22/2021   Last thyroid functions Lab Results  Component Value Date   TSH 1.310 03/22/2021   Last vitamin D Lab Results  Component Value Date   VD25OH 16.9 (L) 03/22/2021   The 10-year ASCVD risk score (Arnett DK, et al., 2019) is: 16.6%    Assessment & Plan:   Problem List Items Addressed This Visit       Hypertension  associated with diabetes ([place])    Not currently on any antihypertensive therapy.  He has previously been prescribed lisinopril 5 mg daily.  His blood pressure today is 149/84. -No medication changes today.  Baseline labs have been ordered.  Will follow-up in 4 weeks and start appropriate medications at that time.      Obstructive sleep apnea    Prior history of OSA.  He endorses compliance with CPAP.      Type 2 diabetes mellitus without complications ([place]) - Primary    Last A1c 7.1 in November 2022.  He has previously been prescribed metformin 1000 mg daily.  Not currently on any diabetes related medications. -Baseline labs ordered today, including repeat A1c -Urine microalbumin/creatinine ratio has been ordered today -We will address remaining diabetes related preventative care items at follow-up in 4 weeks      HLD (hyperlipidemia)    Previously prescribed atorvastatin 10 mg daily.  His lipid panel was last updated in November 2022.  Total cholesterol 214, LDL 146 at that time. -Repeat lipid panel ordered today -Follow-up in 4 weeks and start appropriate medication      Return in about 4 weeks (around 06/01/2022) for review labs, HTN, DM.    Johnette Abraham, MD

## 2022-05-09 NOTE — Assessment & Plan Note (Addendum)
Last A1c 7.1 in November 2022.  He has previously been prescribed metformin 1000 mg daily.  Not currently on any diabetes related medications. -Baseline labs ordered today, including repeat A1c -Urine microalbumin/creatinine ratio has been ordered today -We will address remaining diabetes related preventative care items at follow-up in 4 weeks

## 2022-05-09 NOTE — Assessment & Plan Note (Signed)
Previously prescribed atorvastatin 10 mg daily.  His lipid panel was last updated in November 2022.  Total cholesterol 214, LDL 146 at that time. -Repeat lipid panel ordered today -Follow-up in 4 weeks and start appropriate medication

## 2022-05-09 NOTE — Assessment & Plan Note (Signed)
Prior history of OSA.  He endorses compliance with CPAP.

## 2022-05-09 NOTE — Assessment & Plan Note (Signed)
Not currently on any antihypertensive therapy.  He has previously been prescribed lisinopril 5 mg daily.  His blood pressure today is 149/84. -No medication changes today.  Baseline labs have been ordered.  Will follow-up in 4 weeks and start appropriate medications at that time.

## 2022-05-11 ENCOUNTER — Encounter: Payer: Self-pay | Admitting: Internal Medicine

## 2022-05-11 DIAGNOSIS — J351 Hypertrophy of tonsils: Secondary | ICD-10-CM

## 2022-06-01 ENCOUNTER — Ambulatory Visit: Payer: Managed Care, Other (non HMO) | Admitting: Internal Medicine

## 2022-06-12 LAB — CMP14+EGFR
ALT: 14 IU/L (ref 0–44)
AST: 19 IU/L (ref 0–40)
Albumin/Globulin Ratio: 1.8 (ref 1.2–2.2)
Albumin: 4.5 g/dL (ref 3.8–4.9)
Alkaline Phosphatase: 76 IU/L (ref 44–121)
BUN/Creatinine Ratio: 17 (ref 9–20)
BUN: 15 mg/dL (ref 6–24)
Bilirubin Total: 0.3 mg/dL (ref 0.0–1.2)
CO2: 22 mmol/L (ref 20–29)
Calcium: 9.4 mg/dL (ref 8.7–10.2)
Chloride: 107 mmol/L — ABNORMAL HIGH (ref 96–106)
Creatinine, Ser: 0.88 mg/dL (ref 0.76–1.27)
Globulin, Total: 2.5 g/dL (ref 1.5–4.5)
Glucose: 110 mg/dL — ABNORMAL HIGH (ref 70–99)
Potassium: 4.7 mmol/L (ref 3.5–5.2)
Sodium: 142 mmol/L (ref 134–144)
Total Protein: 7 g/dL (ref 6.0–8.5)
eGFR: 101 mL/min/{1.73_m2} (ref >59)

## 2022-06-12 LAB — CBC WITH DIFFERENTIAL/PLATELET
Basophils Absolute: 0.1 10*3/uL (ref 0.0–0.2)
Basos: 1 %
EOS (ABSOLUTE): 0.3 10*3/uL (ref 0.0–0.4)
Eos: 4 %
Hematocrit: 44 % (ref 37.5–51.0)
Hemoglobin: 15.1 g/dL (ref 13.0–17.7)
Immature Grans (Abs): 0 10*3/uL (ref 0.0–0.1)
Immature Granulocytes: 0 %
Lymphocytes Absolute: 2.6 10*3/uL (ref 0.7–3.1)
Lymphs: 33 %
MCH: 30.4 pg (ref 26.6–33.0)
MCHC: 34.3 g/dL (ref 31.5–35.7)
MCV: 89 fL (ref 79–97)
Monocytes Absolute: 0.8 10*3/uL (ref 0.1–0.9)
Monocytes: 10 %
Neutrophils Absolute: 4.1 10*3/uL (ref 1.4–7.0)
Neutrophils: 52 %
Platelets: 254 10*3/uL (ref 150–450)
RBC: 4.97 x10E6/uL (ref 4.14–5.80)
RDW: 12.5 % (ref 11.6–15.4)
WBC: 7.9 10*3/uL (ref 3.4–10.8)

## 2022-06-12 LAB — LIPID PANEL
Chol/HDL Ratio: 4.3 ratio (ref 0.0–5.0)
Cholesterol, Total: 203 mg/dL — ABNORMAL HIGH (ref 100–199)
HDL: 47 mg/dL (ref >39)
LDL Chol Calc (NIH): 135 mg/dL — ABNORMAL HIGH (ref 0–99)
Triglycerides: 119 mg/dL (ref 0–149)
VLDL Cholesterol Cal: 21 mg/dL (ref 5–40)

## 2022-06-12 LAB — B12 AND FOLATE PANEL
Folate: 8.8 ng/mL (ref >3.0)
Vitamin B-12: 325 pg/mL (ref 232–1245)

## 2022-06-12 LAB — MICROALBUMIN / CREATININE URINE RATIO
Creatinine, Urine: 160.5 mg/dL
Microalb/Creat Ratio: 3 mg/g creat (ref 0–29)
Microalbumin, Urine: 5.6 ug/mL

## 2022-06-12 LAB — TSH+FREE T4
Free T4: 1.03 ng/dL (ref 0.82–1.77)
TSH: 1.21 u[IU]/mL (ref 0.450–4.500)

## 2022-06-12 LAB — HEMOGLOBIN A1C
Est. average glucose Bld gHb Est-mCnc: 166 mg/dL
Hgb A1c MFr Bld: 7.4 % — ABNORMAL HIGH (ref 4.8–5.6)

## 2022-06-12 LAB — VITAMIN D 25 HYDROXY (VIT D DEFICIENCY, FRACTURES): Vit D, 25-Hydroxy: 12.3 ng/mL — ABNORMAL LOW (ref 30.0–100.0)

## 2022-06-13 ENCOUNTER — Encounter: Payer: Self-pay | Admitting: Internal Medicine

## 2022-06-13 ENCOUNTER — Ambulatory Visit: Payer: Managed Care, Other (non HMO) | Admitting: Internal Medicine

## 2022-06-13 VITALS — BP 151/84 | HR 86 | Ht 73.0 in | Wt 253.6 lb

## 2022-06-13 DIAGNOSIS — Z23 Encounter for immunization: Secondary | ICD-10-CM | POA: Insufficient documentation

## 2022-06-13 DIAGNOSIS — Z1211 Encounter for screening for malignant neoplasm of colon: Secondary | ICD-10-CM

## 2022-06-13 DIAGNOSIS — E785 Hyperlipidemia, unspecified: Secondary | ICD-10-CM | POA: Diagnosis not present

## 2022-06-13 DIAGNOSIS — I152 Hypertension secondary to endocrine disorders: Secondary | ICD-10-CM

## 2022-06-13 DIAGNOSIS — E119 Type 2 diabetes mellitus without complications: Secondary | ICD-10-CM | POA: Diagnosis not present

## 2022-06-13 DIAGNOSIS — E1159 Type 2 diabetes mellitus with other circulatory complications: Secondary | ICD-10-CM

## 2022-06-13 DIAGNOSIS — E559 Vitamin D deficiency, unspecified: Secondary | ICD-10-CM

## 2022-06-13 HISTORY — DX: Encounter for screening for malignant neoplasm of colon: Z12.11

## 2022-06-13 MED ORDER — VITAMIN D (ERGOCALCIFEROL) 1.25 MG (50000 UNIT) PO CAPS: 50000.0000 [IU] | ORAL_CAPSULE | ORAL | 0 refills | Status: AC

## 2022-06-13 MED ORDER — ATORVASTATIN CALCIUM 20 MG PO TABS: 20.0000 mg | ORAL_TABLET | Freq: Every day | ORAL | 3 refills | Status: DC

## 2022-06-13 MED ORDER — METFORMIN HCL ER 500 MG PO TB24: ORAL_TABLET | ORAL | 0 refills | Status: DC

## 2022-06-13 MED ORDER — LISINOPRIL 10 MG PO TABS: 10.0000 mg | ORAL_TABLET | Freq: Every day | ORAL | 2 refills | Status: DC

## 2022-06-13 NOTE — Assessment & Plan Note (Signed)
BP remains elevated today, 142/78 initially and 151/84 on repeat. -Start lisinopril 10 mg daily -Follow-up in 4 weeks for HTN check

## 2022-06-13 NOTE — Assessment & Plan Note (Signed)
A1c 7.4 on labs from last week.  He is not currently on any medication. -Start metformin XR 500 mg daily and increase dose to 1000 mg twice daily over the next 4 weeks -Seen by ophthalmology within the last month.  We will request records today. -Diabetic foot exam to be completed at his next appointment

## 2022-06-13 NOTE — Assessment & Plan Note (Signed)
Influenza vaccine administered today.

## 2022-06-13 NOTE — Assessment & Plan Note (Signed)
Lipid panel updated last week.  Total cholesterol 203 and LDL 135.  He is not currently on any cholesterol-lowering therapy.  His 10-year ASCVD risk score today is 16.1%. -Start atorvastatin 20 mg daily

## 2022-06-13 NOTE — Patient Instructions (Signed)
It was a pleasure to see you today.  Thank you for giving [place] the opportunity to be involved in your care.  Below is a brief recap of your visit and next steps.  We will plan to see you again in 4 weeks.  Summary Start atorvastatin 20 mg daily, lisinopril 10 mg daily, and metformin XR 500 mg ramping up to 1000 mg twice daily Cologuard ordered today Weekly vitamin D supplement ordered today You will receive your flu shot Follow up in 4 weeks

## 2022-06-13 NOTE — Assessment & Plan Note (Signed)
Noted on labs from last week. -High-dose, weekly vitamin D supplementation x 12 weeks has been prescribed today

## 2022-06-13 NOTE — Assessment & Plan Note (Signed)
Cologuard ordered today °

## 2022-06-13 NOTE — Progress Notes (Signed)
Established Patient Office Visit  Subjective   Patient ID: Jon Washington, male    DOB: Dec 24, 1965  Age: 57 y.o. MRN: [id number]  Chief Complaint  Patient presents with   Hypertension    Follow up   Diabetes    Follow up   Jon Washington returns to care today for follow-up.  He was last seen by me on 1/4.  Basic labs were completed at that time and 1 month follow-up was arranged to review the results and to discuss appropriate medication changes.  There have been no acute interval events. Jon Washington reports feeling well today.  He is asymptomatic and has no acute concerns to discuss.  He states that he started going to the [facility] yesterday and plans to continue doing so multiple times weekly.  Past Medical History:  Diagnosis Date   Chronic back pain    Colon cancer screening 06/13/2022   HLD (hyperlipidemia) 06/13/2016   Hypertension    Sleep apnea    Past Surgical History:  Procedure Laterality Date   APPENDECTOMY  1974   HERNIA REPAIR  1978   Social History   Tobacco Use   Smoking status: Former    Packs/day: 1.00    Types: Cigarettes    Start date: 05/01/1980    Quit date: 05/01/1997    Years since quitting: 25.1   Smokeless tobacco: Never  Vaping Use   Vaping Use: Former   Devices: started in 1982 quit in 1994  Substance Use Topics   Alcohol use: No   Drug use: No   Family History  Problem Relation Age of Onset   Thyroid disease Mother    Alcohol abuse Father    Arthritis Father    Cancer Father        lymphoma   COPD Father    Heart disease Father        irreg beat   Heart disease Sister 90       CAD   Hypertension Sister    Cancer Sister        hodgkins, breast, skin   Breast cancer Sister    Heart disease Maternal Uncle    Mental illness Maternal Grandmother        social anxiety   Heart disease Paternal Grandfather 30       heart attack   No Known Allergies  Review of Systems  Constitutional:  Negative for chills and fever.  HENT:   Negative for sore throat.   Respiratory:  Negative for cough and shortness of breath.   Cardiovascular:  Negative for chest pain, palpitations and leg swelling.  Gastrointestinal:  Negative for abdominal pain, blood in stool, constipation, diarrhea, nausea and vomiting.  Genitourinary:  Negative for dysuria and hematuria.  Musculoskeletal:  Negative for myalgias.  Skin:  Negative for itching and rash.  Neurological:  Negative for dizziness and headaches.  Psychiatric/Behavioral:  Negative for depression and suicidal ideas.      Objective:     BP (!) 151/84   Pulse 86   Ht 6' 1"$  (1.854 m)   Wt 253 lb 9.6 oz (115 kg)   SpO2 95%   BMI 33.46 kg/m  BP Readings from Last 3 Encounters:  06/13/22 (!) 151/84  05/04/22 (!) 149/84  07/29/21 (!) 146/95   Physical Exam Vitals reviewed.  Constitutional:      General: He is not in acute distress.    Appearance: Normal appearance. He is obese. He is not ill-appearing.  HENT:  Head: Normocephalic and atraumatic.     Right Ear: External ear normal.     Left Ear: External ear normal.     Nose: Nose normal. No congestion or rhinorrhea.     Mouth/Throat:     Mouth: Mucous membranes are moist.     Pharynx: Oropharynx is clear.  Eyes:     General: No scleral icterus.    Extraocular Movements: Extraocular movements intact.     Conjunctiva/sclera: Conjunctivae normal.     Pupils: Pupils are equal, round, and reactive to light.  Cardiovascular:     Rate and Rhythm: Normal rate and regular rhythm.     Pulses: Normal pulses.     Heart sounds: Normal heart sounds. No murmur heard. Pulmonary:     Effort: Pulmonary effort is normal.     Breath sounds: Normal breath sounds. No wheezing, rhonchi or rales.  Abdominal:     General: Abdomen is flat. Bowel sounds are normal. There is no distension.     Palpations: Abdomen is soft.     Tenderness: There is no abdominal tenderness.  Musculoskeletal:        General: No swelling or deformity.  Normal range of motion.     Cervical back: Normal range of motion.  Skin:    General: Skin is warm and dry.     Capillary Refill: Capillary refill takes less than 2 seconds.  Neurological:     General: No focal deficit present.     Mental Status: He is alert and oriented to person, place, and time.     Motor: No weakness.  Psychiatric:        Mood and Affect: Mood normal.        Behavior: Behavior normal.        Thought Content: Thought content normal.   Last CBC Lab Results  Component Value Date   WBC 7.9 06/09/2022   HGB 15.1 06/09/2022   HCT 44.0 06/09/2022   MCV 89 06/09/2022   MCH 30.4 06/09/2022   RDW 12.5 06/09/2022   PLT 254 123456   Last metabolic panel Lab Results  Component Value Date   GLUCOSE 110 (H) 06/09/2022   NA 142 06/09/2022   K 4.7 06/09/2022   CL 107 (H) 06/09/2022   CO2 22 06/09/2022   BUN 15 06/09/2022   CREATININE 0.88 06/09/2022   EGFR 101 06/09/2022   CALCIUM 9.4 06/09/2022   PROT 7.0 06/09/2022   ALBUMIN 4.5 06/09/2022   LABGLOB 2.5 06/09/2022   AGRATIO 1.8 06/09/2022   BILITOT 0.3 06/09/2022   ALKPHOS 76 06/09/2022   AST 19 06/09/2022   ALT 14 06/09/2022   Last lipids Lab Results  Component Value Date   CHOL 203 (H) 06/09/2022   HDL 47 06/09/2022   LDLCALC 135 (H) 06/09/2022   TRIG 119 06/09/2022   CHOLHDL 4.3 06/09/2022   Last hemoglobin A1c Lab Results  Component Value Date   HGBA1C 7.4 (H) 06/09/2022   Last thyroid functions Lab Results  Component Value Date   TSH 1.210 06/09/2022   Last vitamin D Lab Results  Component Value Date   VD25OH 12.3 (L) 06/09/2022   Last vitamin B12 and Folate Lab Results  Component Value Date   VITAMINB12 325 06/09/2022   FOLATE 8.8 06/09/2022   The 10-year ASCVD risk score (Arnett DK, et al., 2019) is: 18.7%    Assessment & Plan:   Problem List Items Addressed This Visit       Hypertension associated with diabetes (  HCC)    BP remains elevated today, 142/78 initially  and 151/84 on repeat. -Start lisinopril 10 mg daily -Follow-up in 4 weeks for HTN check      Type 2 diabetes mellitus without complications (HCC)    123456 7.4 on labs from last week.  He is not currently on any medication. -Start metformin XR 500 mg daily and increase dose to 1000 mg twice daily over the next 4 weeks -Seen by ophthalmology within the last month.  We will request records today. -Diabetic foot exam to be completed at his next appointment      HLD (hyperlipidemia)    Lipid panel updated last week.  Total cholesterol 203 and LDL 135.  He is not currently on any cholesterol-lowering therapy.  His 10-year ASCVD risk score today is 16.1%. -Start atorvastatin 20 mg daily      Vitamin D deficiency - Primary    Noted on labs from last week. -High-dose, weekly vitamin D supplementation x 12 weeks has been prescribed today      Need for influenza vaccination    Influenza vaccine administered today      Colon cancer screening    Cologuard ordered today      Return in about 4 weeks (around 07/11/2022) for HTN, HLD, DM.    Johnette Abraham, MD

## 2022-06-29 ENCOUNTER — Encounter: Payer: Self-pay | Admitting: Radiology

## 2022-07-06 LAB — COLOGUARD: COLOGUARD: NEGATIVE

## 2022-07-13 ENCOUNTER — Ambulatory Visit: Payer: Managed Care, Other (non HMO) | Admitting: Internal Medicine

## 2022-07-13 ENCOUNTER — Encounter: Payer: Self-pay | Admitting: Internal Medicine

## 2022-07-13 VITALS — BP 134/63 | HR 101 | Ht 73.0 in | Wt 245.2 lb

## 2022-07-13 DIAGNOSIS — E119 Type 2 diabetes mellitus without complications: Secondary | ICD-10-CM

## 2022-07-13 DIAGNOSIS — E1159 Type 2 diabetes mellitus with other circulatory complications: Secondary | ICD-10-CM

## 2022-07-13 DIAGNOSIS — E559 Vitamin D deficiency, unspecified: Secondary | ICD-10-CM | POA: Diagnosis not present

## 2022-07-13 DIAGNOSIS — E785 Hyperlipidemia, unspecified: Secondary | ICD-10-CM

## 2022-07-13 DIAGNOSIS — I152 Hypertension secondary to endocrine disorders: Secondary | ICD-10-CM

## 2022-07-13 MED ORDER — METFORMIN HCL ER 500 MG PO TB24: 1000.0000 mg | ORAL_TABLET | Freq: Two times a day (BID) | ORAL | 1 refills | Status: DC

## 2022-07-13 NOTE — Assessment & Plan Note (Signed)
He remains on high-dose, weekly vitamin D supplementation. -Repeat vitamin D level upon completion of weekly vitamin D supplementation.

## 2022-07-13 NOTE — Progress Notes (Signed)
Established Patient Office Visit  Subjective   Patient ID: Jon Washington, male    DOB: 10-08-1965  Age: 57 y.o. MRN: [id number]  Chief Complaint  Patient presents with   Hypertension    Follow up   Diabetes    Follow up   Hyperlipidemia    Follow up   Jon Washington returns to care today for HTN, HLD, and DM follow-up.  He was last seen by me on 2/13 at which time lisinopril was increased to 10 mg daily, metformin XR was initiated, and atorvastatin 20 mg daily was also added.  4-week follow-up was arranged.  There have been no acute interval events. Jon Washington reports feeling well today.  He has not experienced any adverse side effects with recent medication adjustments.  He has no additional concerns to discuss today.  Past Medical History:  Diagnosis Date   Chronic back pain    Colon cancer screening 06/13/2022   HLD (hyperlipidemia) 06/13/2016   Hypertension    Sleep apnea    Past Surgical History:  Procedure Laterality Date   APPENDECTOMY  1974   HERNIA REPAIR  1978   Social History   Tobacco Use   Smoking status: Former    Packs/day: 1    Types: Cigarettes    Start date: 05/01/1980    Quit date: 05/01/1997    Years since quitting: 25.2   Smokeless tobacco: Never  Vaping Use   Vaping Use: Former   Devices: started in 1982 quit in 1994  Substance Use Topics   Alcohol use: No   Drug use: No   Family History  Problem Relation Age of Onset   Thyroid disease Mother    Alcohol abuse Father    Arthritis Father    Cancer Father        lymphoma   COPD Father    Heart disease Father        irreg beat   Heart disease Sister 34       CAD   Hypertension Sister    Cancer Sister        hodgkins, breast, skin   Breast cancer Sister    Heart disease Maternal Uncle    Mental illness Maternal Grandmother        social anxiety   Heart disease Paternal Grandfather 8       heart attack   No Known Allergies  Review of Systems  Constitutional:  Negative for  chills and fever.  HENT:  Negative for sore throat.   Respiratory:  Negative for cough and shortness of breath.   Cardiovascular:  Negative for chest pain, palpitations and leg swelling.  Gastrointestinal:  Negative for abdominal pain, blood in stool, constipation, diarrhea, nausea and vomiting.  Genitourinary:  Negative for dysuria and hematuria.  Musculoskeletal:  Negative for myalgias.  Skin:  Negative for itching and rash.  Neurological:  Negative for dizziness and headaches.  Psychiatric/Behavioral:  Negative for depression and suicidal ideas.      Objective:     BP 134/63   Pulse (!) 101   Ht '6\' 1"'$  (1.854 m)   Wt 245 lb 3.2 oz (111.2 kg)   SpO2 96%   BMI 32.35 kg/m  BP Readings from Last 3 Encounters:  07/13/22 134/63  06/13/22 (!) 151/84  05/04/22 (!) 149/84   Physical Exam Vitals reviewed.  Constitutional:      General: He is not in acute distress.    Appearance: Normal appearance. He is obese. He is not  ill-appearing.  HENT:     Head: Normocephalic and atraumatic.     Right Ear: External ear normal.     Left Ear: External ear normal.     Nose: Nose normal. No congestion or rhinorrhea.     Mouth/Throat:     Mouth: Mucous membranes are moist.     Pharynx: Oropharynx is clear.  Eyes:     General: No scleral icterus.    Extraocular Movements: Extraocular movements intact.     Conjunctiva/sclera: Conjunctivae normal.     Pupils: Pupils are equal, round, and reactive to light.  Cardiovascular:     Rate and Rhythm: Normal rate and regular rhythm.     Pulses: Normal pulses.     Heart sounds: Normal heart sounds. No murmur heard. Pulmonary:     Effort: Pulmonary effort is normal.     Breath sounds: Normal breath sounds. No wheezing, rhonchi or rales.  Abdominal:     General: Abdomen is flat. Bowel sounds are normal. There is no distension.     Palpations: Abdomen is soft.     Tenderness: There is no abdominal tenderness.  Musculoskeletal:        General: No  swelling or deformity. Normal range of motion.     Cervical back: Normal range of motion.  Skin:    General: Skin is warm and dry.     Capillary Refill: Capillary refill takes less than 2 seconds.  Neurological:     General: No focal deficit present.     Mental Status: He is alert and oriented to person, place, and time.     Motor: No weakness.  Psychiatric:        Mood and Affect: Mood normal.        Behavior: Behavior normal.        Thought Content: Thought content normal.   Diabetic foot exam was performed.  No deformities or other abnormal visual findings.  Posterior tibialis and dorsalis pulse intact bilaterally.  Intact to touch and monofilament testing bilaterally.    Last CBC Lab Results  Component Value Date   WBC 7.9 06/09/2022   HGB 15.1 06/09/2022   HCT 44.0 06/09/2022   MCV 89 06/09/2022   MCH 30.4 06/09/2022   RDW 12.5 06/09/2022   PLT 254 123456   Last metabolic panel Lab Results  Component Value Date   GLUCOSE 110 (H) 06/09/2022   NA 142 06/09/2022   K 4.7 06/09/2022   CL 107 (H) 06/09/2022   CO2 22 06/09/2022   BUN 15 06/09/2022   CREATININE 0.88 06/09/2022   EGFR 101 06/09/2022   CALCIUM 9.4 06/09/2022   PROT 7.0 06/09/2022   ALBUMIN 4.5 06/09/2022   LABGLOB 2.5 06/09/2022   AGRATIO 1.8 06/09/2022   BILITOT 0.3 06/09/2022   ALKPHOS 76 06/09/2022   AST 19 06/09/2022   ALT 14 06/09/2022   Last lipids Lab Results  Component Value Date   CHOL 203 (H) 06/09/2022   HDL 47 06/09/2022   LDLCALC 135 (H) 06/09/2022   TRIG 119 06/09/2022   CHOLHDL 4.3 06/09/2022   Last hemoglobin A1c Lab Results  Component Value Date   HGBA1C 7.4 (H) 06/09/2022   Last thyroid functions Lab Results  Component Value Date   TSH 1.210 06/09/2022   Last vitamin D Lab Results  Component Value Date   VD25OH 12.3 (L) 06/09/2022   Last vitamin B12 and Folate Lab Results  Component Value Date   VITAMINB12 325 06/09/2022   FOLATE 8.8  06/09/2022   The  10-year ASCVD risk score (Arnett DK, et al., 2019) is: 15.4%    Assessment & Plan:   Problem List Items Addressed This Visit       Hypertension associated with diabetes ([place])    Lisinopril was increased to 10 mg daily at his last appointment.  His blood pressure today is 134/63. -No additional medication changes today. -Repeat CMP ordered      Type 2 diabetes mellitus without complications (HCC)    123456 7.4 on labs from last month.  Metformin XR was initiated last month with plans to gradually increase to 1000 mg twice daily.  He has not experienced any adverse side effects with starting metformin. -No additional medication changes today -Diabetic foot exam completed today -Repeat CMP ordered -Repeat A1c at follow-up in 3 months      HLD (hyperlipidemia)    Atorvastatin 20 mg daily was initiated at his last appointment in the setting of elevated LDL and 10-year ASCVD risk.  He has not experienced any adverse side effects with starting atorvastatin. -Repeat CMP ordered today      Vitamin D deficiency - Primary    He remains on high-dose, weekly vitamin D supplementation. -Repeat vitamin D level upon completion of weekly vitamin D supplementation.       Return in about 3 months (around 10/13/2022).    Johnette Abraham, MD

## 2022-07-13 NOTE — Assessment & Plan Note (Addendum)
Lisinopril was increased to 10 mg daily at his last appointment.  His blood pressure today is 134/63. -No additional medication changes today. -Repeat CMP ordered

## 2022-07-13 NOTE — Assessment & Plan Note (Addendum)
A1c 7.4 on labs from last month.  Metformin XR was initiated last month with plans to gradually increase to 1000 mg twice daily.  He has not experienced any adverse side effects with starting metformin. -No additional medication changes today -Diabetic foot exam completed today -Repeat CMP ordered -Repeat A1c at follow-up in 3 months

## 2022-07-13 NOTE — Assessment & Plan Note (Signed)
Atorvastatin 20 mg daily was initiated at his last appointment in the setting of elevated LDL and 10-year ASCVD risk.  He has not experienced any adverse side effects with starting atorvastatin. -Repeat CMP ordered today

## 2022-07-13 NOTE — Patient Instructions (Signed)
It was a pleasure to see you today.  Thank you for giving [place] the opportunity to be involved in your care.  Below is a brief recap of your visit and next steps.  We will plan to see you again in 3 months.  Summary No medication changes today Repeat vitamin D level when you finish weekly supplementation We will follow up in 3 months

## 2022-08-10 ENCOUNTER — Other Ambulatory Visit: Payer: Self-pay

## 2022-08-10 ENCOUNTER — Emergency Department (HOSPITAL_COMMUNITY)
Admission: EM | Admit: 2022-08-10 | Discharge: 2022-08-11 | Disposition: A | Discharge status: Home / Self Care | Payer: Managed Care, Other (non HMO) | Attending: Emergency Medicine | Admitting: Emergency Medicine

## 2022-08-10 ENCOUNTER — Encounter (HOSPITAL_COMMUNITY): Payer: Self-pay

## 2022-08-10 DIAGNOSIS — R1084 Generalized abdominal pain: Secondary | ICD-10-CM | POA: Diagnosis present

## 2022-08-10 DIAGNOSIS — N23 Unspecified renal colic: Secondary | ICD-10-CM | POA: Insufficient documentation

## 2022-08-10 LAB — COMPREHENSIVE METABOLIC PANEL
ALT: 19 U/L (ref 0–44)
AST: 22 U/L (ref 15–41)
Albumin: 4.3 g/dL (ref 3.5–5.0)
Alkaline Phosphatase: 70 U/L (ref 38–126)
Anion gap: 11 (ref 5–15)
BUN: 18 mg/dL (ref 6–20)
CO2: 25 mmol/L (ref 22–32)
Calcium: 9.4 mg/dL (ref 8.9–10.3)
Chloride: 99 mmol/L (ref 98–111)
Creatinine, Ser: 1.37 mg/dL — ABNORMAL HIGH (ref 0.61–1.24)
GFR, Estimated: >60 mL/min (ref >60)
Glucose, Bld: 214 mg/dL — ABNORMAL HIGH (ref 70–99)
Potassium: 4.5 mmol/L (ref 3.5–5.1)
Sodium: 135 mmol/L (ref 135–145)
Total Bilirubin: 0.7 mg/dL (ref 0.3–1.2)
Total Protein: 7.9 g/dL (ref 6.5–8.1)

## 2022-08-10 LAB — CBC
HCT: 47 % (ref 39.0–52.0)
Hemoglobin: 15.9 g/dL (ref 13.0–17.0)
MCH: 30.3 pg (ref 26.0–34.0)
MCHC: 33.8 g/dL (ref 30.0–36.0)
MCV: 89.7 fL (ref 80.0–100.0)
Platelets: 254 10*3/uL (ref 150–400)
RBC: 5.24 MIL/uL (ref 4.22–5.81)
RDW: 12.6 % (ref 11.5–15.5)
WBC: 17.5 10*3/uL — ABNORMAL HIGH (ref 4.0–10.5)
nRBC: 0 % (ref 0.0–0.2)

## 2022-08-10 LAB — LIPASE, BLOOD: Lipase: 26 U/L (ref 11–51)

## 2022-08-10 MED ORDER — ONDANSETRON HCL 4 MG/2ML IJ SOLN
4.0000 mg | Freq: Once | INTRAMUSCULAR | Status: AC
  Administered 2022-08-11: 4 mg via INTRAVENOUS
  Filled 2022-08-10: qty 2

## 2022-08-10 MED ORDER — HYDROMORPHONE HCL 1 MG/ML IJ SOLN
1.0000 mg | Freq: Once | INTRAMUSCULAR | Status: AC
  Administered 2022-08-11: 1 mg via INTRAVENOUS
  Filled 2022-08-10: qty 1

## 2022-08-10 MED ORDER — SODIUM CHLORIDE 0.9 % IV BOLUS
1000.0000 mL | Freq: Once | INTRAVENOUS | Status: AC
  Administered 2022-08-11: 1000 mL via INTRAVENOUS

## 2022-08-10 NOTE — ED Triage Notes (Signed)
Pt reports abd pain and "feeling constipated" with nausea that started this morning. Pt said he had small amount of hard pellet like stool this morning, but has been unable to have good BM, pt also reports nausea, but says he is unable to vomit.

## 2022-08-10 NOTE — ED Provider Notes (Signed)
[place] EMERGENCY DEPARTMENT AT [facility] Provider Note   CSN: [id number] Arrival date & time: 08/10/22  2115     History  Chief Complaint  Patient presents with   Constipation    Dry heaves/nausea    Jon Washington is a 57 y.o. male.  Patient presents to the emergency department with complaints of diffuse abd pain since this morning.  He reports that there has been persistent nausea, has only been able to dry heave. Feels constipated.  He had a bowel movement yesterday but cannot pass any stool today.  He has been feeling ill, weak, dizzy, diaphoretic at times.  No chest pain.  No shortness of breath.       Home Medications Prior to Admission medications   Medication Sig Start Date End Date Taking? Authorizing Provider  amoxicillin (AMOXIL) 500 MG capsule Take 500 mg by mouth 4 (four) times daily. 05/12/22  Yes [provider]  oxyCODONE-acetaminophen (PERCOCET) 5-325 MG tablet Take 1-2 tablets by mouth every 4 (four) hours as needed. 08/11/22  Yes Tishara Pizano, Canary Brim, MD  oxyCODONE-acetaminophen (PERCOCET/ROXICET) 5-325 MG tablet Take 1 tablet by mouth every 6 (six) hours as needed for severe pain. 08/11/22  Yes Yohance Hathorne, Canary Brim, MD  tamsulosin (FLOMAX) 0.4 MG CAPS capsule Take 1 capsule (0.4 mg total) by mouth daily. 08/11/22  Yes Shantese Raven, Canary Brim, MD  atorvastatin (LIPITOR) 20 MG tablet Take 1 tablet (20 mg total) by mouth daily. 06/13/22   Billie Lade, MD  lisinopril (ZESTRIL) 10 MG tablet Take 1 tablet (10 mg total) by mouth daily. 06/13/22 09/11/22  Billie Lade, MD  metFORMIN (GLUCOPHAGE-XR) 500 MG 24 hr tablet Take 2 tablets (1,000 mg total) by mouth 2 (two) times daily with a meal. 07/13/22 01/09/23  Billie Lade, MD  Vitamin D, Ergocalciferol, (DRISDOL) 1.25 MG (50000 UNIT) CAPS capsule Take 1 capsule (50,000 Units total) by mouth every 7 (seven) days for 12 doses. 06/13/22 08/30/22  Billie Lade, MD      Allergies     Patient has no known allergies.    Review of Systems   Review of Systems  Physical Exam Updated Vital Signs BP 134/79   Pulse 87   Temp 98.3 F (36.8 C) (Oral)   Resp 18   Ht 6\' 1"  (1.854 m)   Wt 111.1 kg   SpO2 97%   BMI 32.32 kg/m  Physical Exam Vitals and nursing note reviewed.  Constitutional:      General: He is not in acute distress.    Appearance: He is well-developed.  HENT:     Head: Normocephalic and atraumatic.     Mouth/Throat:     Mouth: Mucous membranes are moist.  Eyes:     General: Vision grossly intact. Gaze aligned appropriately.     Extraocular Movements: Extraocular movements intact.     Conjunctiva/sclera: Conjunctivae normal.  Cardiovascular:     Rate and Rhythm: Normal rate and regular rhythm.     Pulses: Normal pulses.     Heart sounds: Normal heart sounds, S1 normal and S2 normal. No murmur heard.    No friction rub. No gallop.  Pulmonary:     Effort: Pulmonary effort is normal. No respiratory distress.     Breath sounds: Normal breath sounds.  Abdominal:     General: Bowel sounds are decreased. There is distension.     Palpations: Abdomen is soft.     Tenderness: There is generalized abdominal tenderness. There is no  guarding or rebound.     Hernia: No hernia is present.  Musculoskeletal:        General: No swelling.     Cervical back: Full passive range of motion without pain, normal range of motion and neck supple. No pain with movement, spinous process tenderness or muscular tenderness. Normal range of motion.     Right lower leg: No edema.     Left lower leg: No edema.  Skin:    General: Skin is warm and dry.     Capillary Refill: Capillary refill takes less than 2 seconds.     Findings: No ecchymosis, erythema, lesion or wound.  Neurological:     Mental Status: He is alert and oriented to person, place, and time.     GCS: GCS eye subscore is 4. GCS verbal subscore is 5. GCS motor subscore is 6.     Cranial Nerves: Cranial nerves  2-12 are intact.     Sensory: Sensation is intact.     Motor: Motor function is intact. No weakness or abnormal muscle tone.     Coordination: Coordination is intact.  Psychiatric:        Mood and Affect: Mood normal.        Speech: Speech normal.        Behavior: Behavior normal.     ED Results / Procedures / Treatments   Labs (all labs ordered are listed, but only abnormal results are displayed) Labs Reviewed  COMPREHENSIVE METABOLIC PANEL - Abnormal; Notable for the following components:      Result Value   Glucose, Bld 214 (*)    Creatinine, Ser 1.37 (*)    All other components within normal limits  CBC - Abnormal; Notable for the following components:   WBC 17.5 (*)    All other components within normal limits  URINALYSIS, ROUTINE W REFLEX MICROSCOPIC - Abnormal; Notable for the following components:   APPearance HAZY (*)    Glucose, UA >=500 (*)    Hgb urine dipstick LARGE (*)    Ketones, ur 5 (*)    Protein, ur 30 (*)    Bacteria, UA RARE (*)    All other components within normal limits  LIPASE, BLOOD    EKG None  Radiology CT ABDOMEN PELVIS W CONTRAST  Result Date: 08/11/2022 CLINICAL DATA:  Abdominal pain, acute, nonlocalized EXAM: CT ABDOMEN AND PELVIS WITH CONTRAST TECHNIQUE: Multidetector CT imaging of the abdomen and pelvis was performed using the standard protocol following bolus administration of intravenous contrast. RADIATION DOSE REDUCTION: This exam was performed according to the departmental dose-optimization program which includes automated exposure control, adjustment of the mA and/or kV according to patient size and/or use of iterative reconstruction technique. CONTRAST:  OMNIPAQUE IOHEXOL 300 MG/ML  SOLN COMPARISON:  None Available. FINDINGS: Lower chest: No acute abnormality Hepatobiliary: No focal hepatic abnormality. Gallbladder unremarkable. Pancreas: No focal abnormality or ductal dilatation. Spleen: No focal abnormality.  Normal size.  Adrenals/Urinary Tract: Right hydronephrosis and perinephric stranding due to 1 or possibly 2 adjacent mid right ureteral stones measuring up to 7 mm in cross-sectional diameter. Nonobstructing stones in the midpole of the right kidney and lower pole of the left kidney. Urinary bladder and adrenal glands unremarkable. Stomach/Bowel: Stomach and large bowel unremarkable. There are mildly prominent mid abdominal small bowel loops. This could reflect focal ileus, less likely small bowel obstruction. Vascular/Lymphatic: No evidence of aneurysm or adenopathy. Scattered aortic atherosclerosis. Reproductive: No visible focal abnormality. Other: No free fluid or  free air. Musculoskeletal: No acute bony abnormality. IMPRESSION: Right hydronephrosis and perinephric stranding due to 1 or possibly 2 adjacent mid right ureteral stones with a cross-sectional diameter of 7 mm. Mildly prominent upper to mid small bowel loops, favor focal ileus, less likely early low grade small bowel obstruction. Aortic atherosclerosis. Electronically Signed   By: Charlett NoseKevin  Dover M.D.   On: 08/11/2022 01:06    Procedures Procedures    Medications Ordered in ED Medications  ketorolac (TORADOL) 30 MG/ML injection 15 mg (has no administration in time range)  sodium chloride 0.9 % bolus 1,000 mL (1,000 mLs Intravenous New Bag/Given 08/11/22 0022)  HYDROmorphone (DILAUDID) injection 1 mg (1 mg Intravenous Given 08/11/22 0023)  ondansetron (ZOFRAN) injection 4 mg (4 mg Intravenous Given 08/11/22 0023)  iohexol (OMNIPAQUE) 300 MG/ML solution 100 mL (100 mLs Intravenous Contrast Given 08/11/22 0039)    ED Course/ Medical Decision Making/ A&P                             Medical Decision Making Amount and/or Complexity of Data Reviewed Labs: ordered. Radiology: ordered.  Risk Prescription drug management.   Differential Diagnosis considered includes, but not limited to: Appendicitis; colitis; diverticulitis; bowel obstruction; cystitis;  nephrolithiasis; pyelonephritis.  Presents to the emergency department for evaluation of abdominal pain.  Patient has had nausea but no vomiting.  He feels full, distended and like he may be constipated.  He does, however, report a normal bowel movement yesterday.  Examination is nonfocal.  He does not have any focal tenderness, no tenderness to McBurney's point, negative Murphy sign.  Blood work does not reveal any significant findings.  Urine with large blood.  CT abdomen and pelvis does confirm right ureteral stone with hydronephrosis that explains his symptoms.  Will treat with analgesia, follow-up with [facility].  Return if symptoms worsen.        Final Clinical Impression(s) / ED Diagnoses Final diagnoses:  Ureteral colic    Rx / DC Orders ED Discharge Orders          Ordered    oxyCODONE-acetaminophen (PERCOCET/ROXICET) 5-325 MG tablet  Every 6 hours PRN        08/11/22 0135    oxyCODONE-acetaminophen (PERCOCET) 5-325 MG tablet  Every 4 hours PRN        08/11/22 0136    tamsulosin (FLOMAX) 0.4 MG CAPS capsule  Daily        08/11/22 0136              Gilda CreasePollina, Nehemyah J, MD 08/11/22 [phone]

## 2022-08-11 ENCOUNTER — Emergency Department (HOSPITAL_COMMUNITY): Payer: Managed Care, Other (non HMO)

## 2022-08-11 LAB — URINALYSIS, ROUTINE W REFLEX MICROSCOPIC
Bilirubin Urine: NEGATIVE
Glucose, UA: >=500 mg/dL — AB
Ketones, ur: 5 mg/dL — AB
Leukocytes,Ua: NEGATIVE
Nitrite: NEGATIVE
Protein, ur: 30 mg/dL — AB
RBC / HPF: >50 RBC/hpf (ref 0–5)
Specific Gravity, Urine: 1.02 (ref 1.005–1.030)
pH: 6 (ref 5.0–8.0)

## 2022-08-11 MED ORDER — OXYCODONE-ACETAMINOPHEN 5-325 MG PO TABS: 1.0000 | ORAL_TABLET | ORAL | 0 refills | Status: DC | PRN

## 2022-08-11 MED ORDER — TAMSULOSIN HCL 0.4 MG PO CAPS: 0.4000 mg | ORAL_CAPSULE | Freq: Every day | ORAL | 0 refills | Status: DC

## 2022-08-11 MED ORDER — OXYCODONE-ACETAMINOPHEN 5-325 MG PO TABS: 1.0000 | ORAL_TABLET | Freq: Four times a day (QID) | ORAL | 0 refills | Status: DC | PRN

## 2022-08-11 MED ORDER — IOHEXOL 300 MG/ML  SOLN
100.0000 mL | Freq: Once | INTRAMUSCULAR | Status: AC | PRN
  Administered 2022-08-11: 100 mL via INTRAVENOUS

## 2022-08-11 MED ORDER — KETOROLAC TROMETHAMINE 30 MG/ML IJ SOLN
15.0000 mg | Freq: Once | INTRAMUSCULAR | Status: AC
  Administered 2022-08-11: 15 mg via INTRAVENOUS
  Filled 2022-08-11: qty 1

## 2022-08-11 NOTE — ED Notes (Signed)
Patient transported to CT 

## 2022-08-14 ENCOUNTER — Other Ambulatory Visit: Payer: Self-pay

## 2022-08-14 DIAGNOSIS — N200 Calculus of kidney: Secondary | ICD-10-CM

## 2022-08-14 MED FILL — Oxycodone w/ Acetaminophen Tab 5-325 MG: ORAL | Qty: 6 | Status: AC

## 2022-08-18 ENCOUNTER — Ambulatory Visit: Payer: Managed Care, Other (non HMO) | Admitting: Urology

## 2022-08-18 ENCOUNTER — Encounter: Payer: Self-pay | Admitting: Urology

## 2022-08-18 ENCOUNTER — Ambulatory Visit (HOSPITAL_COMMUNITY)
Admission: RE | Admit: 2022-08-18 | Discharge: 2022-08-18 | Disposition: A | Discharge status: Home / Self Care | Payer: Managed Care, Other (non HMO) | Source: Ambulatory Visit | Attending: Urology | Admitting: Urology

## 2022-08-18 VITALS — BP 136/80 | HR 97

## 2022-08-18 DIAGNOSIS — N201 Calculus of ureter: Secondary | ICD-10-CM

## 2022-08-18 DIAGNOSIS — N200 Calculus of kidney: Secondary | ICD-10-CM

## 2022-08-18 LAB — URINALYSIS, ROUTINE W REFLEX MICROSCOPIC
Bilirubin, UA: NEGATIVE
Ketones, UA: NEGATIVE
Leukocytes,UA: NEGATIVE
Nitrite, UA: NEGATIVE
Protein,UA: NEGATIVE
Specific Gravity, UA: 1.025 (ref 1.005–1.030)
Urobilinogen, Ur: 0.2 mg/dL (ref 0.2–1.0)
pH, UA: 5.5 (ref 5.0–7.5)

## 2022-08-18 LAB — MICROSCOPIC EXAMINATION
Bacteria, UA: NONE SEEN
Urinalysis Comments: 0

## 2022-08-18 NOTE — Progress Notes (Signed)
08/18/2022 10:19 AM   Jon Washington 06/06/1965 [id number]  Referring provider: Billie Lade, MD [address] [place],  [place] [address]  nephrolithiasis   HPI: No Jon Washington is a 57yo here for evaluation of nephrolithiasis. 10 days ago he developed right flank pain and was diagnosed with a 7mm right ureteral calculus.  He has not passed his calculus. This is his first stone events. No nausea or vomiting. KUB shows right proximal ureteral calculus.    PMH: Past Medical History:  Diagnosis Date   Chronic back pain    Colon cancer screening 06/13/2022   HLD (hyperlipidemia) 06/13/2016   Hypertension    Sleep apnea     Surgical History: Past Surgical History:  Procedure Laterality Date   APPENDECTOMY  1974   HERNIA REPAIR  1978    Home Medications:  Allergies as of 08/18/2022   No Known Allergies      Medication List        Accurate as of August 18, 2022 10:19 AM. If you have any questions, ask your nurse or doctor.          amoxicillin 500 MG capsule Commonly known as: AMOXIL Take 500 mg by mouth 4 (four) times daily.   atorvastatin 20 MG tablet Commonly known as: LIPITOR Take 1 tablet (20 mg total) by mouth daily.   lisinopril 10 MG tablet Commonly known as: ZESTRIL Take 1 tablet (10 mg total) by mouth daily.   metFORMIN 500 MG 24 hr tablet Commonly known as: GLUCOPHAGE-XR Take 2 tablets (1,000 mg total) by mouth 2 (two) times daily with a meal.   oxyCODONE-acetaminophen 5-325 MG tablet Commonly known as: PERCOCET/ROXICET Take 1 tablet by mouth every 6 (six) hours as needed for severe pain.   oxyCODONE-acetaminophen 5-325 MG tablet Commonly known as: Percocet Take 1-2 tablets by mouth every 4 (four) hours as needed.   tamsulosin 0.4 MG Caps capsule Commonly known as: FLOMAX Take 1 capsule (0.4 mg total) by mouth daily.   Vitamin D (Ergocalciferol) 1.25 MG (50000 UNIT) Caps capsule Commonly known as: DRISDOL Take 1 capsule  (50,000 Units total) by mouth every 7 (seven) days for 12 doses.        Allergies: No Known Allergies  Family History: Family History  Problem Relation Age of Onset   Thyroid disease Mother    Alcohol abuse Father    Arthritis Father    Cancer Father        lymphoma   COPD Father    Heart disease Father        irreg beat   Heart disease Sister 64       CAD   Hypertension Sister    Cancer Sister        hodgkins, breast, skin   Breast cancer Sister    Heart disease Maternal Uncle    Mental illness Maternal Grandmother        social anxiety   Heart disease Paternal Grandfather 28       heart attack    Social History:  reports that he quit smoking about 25 years ago. His smoking use included cigarettes. He started smoking about 42 years ago. He smoked an average of 1 pack per day. He has never used smokeless tobacco. He reports that he does not drink alcohol and does not use drugs.  ROS: All other review of systems were reviewed and are negative except what is noted above in HPI  Physical Exam: BP 136/80  Pulse 97   Constitutional:  Alert and oriented, No acute distress. HEENT: [place] AT, moist mucus membranes.  Trachea midline, no masses. Cardiovascular: No clubbing, cyanosis, or edema. Respiratory: Normal respiratory effort, no increased work of breathing. GI: Abdomen is soft, nontender, nondistended, no abdominal masses GU: No CVA tenderness.  Lymph: No cervical or inguinal lymphadenopathy. Skin: No rashes, bruises or suspicious lesions. Neurologic: Grossly intact, no focal deficits, moving all 4 extremities. Psychiatric: Normal mood and affect.  Laboratory Data: Lab Results  Component Value Date   WBC 17.5 (H) 08/10/2022   HGB 15.9 08/10/2022   HCT 47.0 08/10/2022   MCV 89.7 08/10/2022   PLT 254 08/10/2022    Lab Results  Component Value Date   CREATININE 1.37 (H) 08/10/2022    No results found for: "PSA"  No results found for: "TESTOSTERONE"  Lab  Results  Component Value Date   HGBA1C 7.4 (H) 06/09/2022    Urinalysis    Component Value Date/Time   COLORURINE YELLOW 08/10/2022 2340   APPEARANCEUR HAZY (A) 08/10/2022 2340   LABSPEC 1.020 08/10/2022 2340   PHURINE 6.0 08/10/2022 2340   GLUCOSEU >=500 (A) 08/10/2022 2340   HGBUR LARGE (A) 08/10/2022 2340   BILIRUBINUR NEGATIVE 08/10/2022 2340   KETONESUR 5 (A) 08/10/2022 2340   PROTEINUR 30 (A) 08/10/2022 2340   NITRITE NEGATIVE 08/10/2022 2340   LEUKOCYTESUR NEGATIVE 08/10/2022 2340    Lab Results  Component Value Date   LABMICR 5.6 06/09/2022   BACTERIA RARE (A) 08/10/2022    Pertinent Imaging: CT 4/12 and KUB today; Inages reviewed and discussed with the patient  No results found for this or any previous visit.  No results found for this or any previous visit.  No results found for this or any previous visit.  No results found for this or any previous visit.  No results found for this or any previous visit.  No valid procedures specified. No results found for this or any previous visit.  No results found for this or any previous visit.   Assessment & Plan:    1. Kidney stones -We discussed the management of kidney stones. These options include observation, ureteroscopy, shockwave lithotripsy (ESWL) and percutaneous nephrolithotomy (PCNL). We discussed which options are relevant to the patient's stone(s). We discussed the natural history of kidney stones as well as the complications of untreated stones and the impact on quality of life without treatment as well as with each of the above listed treatments. We also discussed the efficacy of each treatment in its ability to clear the stone burden. With any of these management options I discussed the signs and symptoms of infection and the need for emergent treatment should these be experienced. For each option we discussed the ability of each procedure to clear the patient of their stone burden.   For observation  I described the risks which include but are not limited to silent renal damage, life-threatening infection, need for emergent surgery, failure to pass stone and pain.   For ureteroscopy I described the risks which include bleeding, infection, damage to contiguous structures, positioning injury, ureteral stricture, ureteral avulsion, ureteral injury, need for prolonged ureteral stent, inability to perform ureteroscopy, need for an interval procedure, inability to clear stone burden, stent discomfort/pain, heart attack, stroke, pulmonary embolus and the inherent risks with general anesthesia.   For shockwave lithotripsy I described the risks which include arrhythmia, kidney contusion, kidney hemorrhage, need for transfusion, pain, inability to adequately break up stone, inability to pass  stone fragments, Steinstrasse, infection associated with obstructing stones, need for alternate surgical procedure, need for repeat shockwave lithotripsy, MI, CVA, PE and the inherent risks with anesthesia/conscious sedation.   For PCNL I described the risks including positioning injury, pneumothorax, hydrothorax, need for chest tube, inability to clear stone burden, renal laceration, arterial venous fistula or malformation, need for embolization of kidney, loss of kidney or renal function, need for repeat procedure, need for prolonged nephrostomy tube, ureteral avulsion, MI, CVA, PE and the inherent risks of general anesthesia.   - The patient would like to proceed with Right ESWL - Urinalysis, Routine w reflex microscopic   No follow-ups on file.  Wilkie Aye, MD  [facility] [place]

## 2022-08-18 NOTE — Patient Instructions (Signed)
ESWL for Kidney Stones  Extracorporeal shock wave lithotripsy (ESWL) is a treatment that can help break up kidney stones that are too large to pass on their own.  This is a nonsurgical procedure that breaks up a kidney stone with shock waves. These shock waves pass through your body and focus on the kidney stone. They cause the kidney stone to break into smaller pieces (fragments) while it is still in the urinary tract. The fragments of stone can pass more easily out of your body in the urine. Tell a health care provider about: Any allergies you have. All medicines you are taking, including vitamins, herbs, eye drops, creams, and over-the-counter medicines. Any problems you or family members have had with anesthetic medicines. Any bleeding problems you have. Any surgeries you have had. Any medical conditions you have. Whether you are pregnant or may be pregnant. What are the risks? Your health care provider will talk with you about risks. These may include: Infection. Bleeding from the kidney. Bruising of the kidney or skin. Scarring of the kidney. This can lead to: Increased blood pressure. Poor kidney function. Return (recurrence) of kidney stones. Damage to other structures or organs. This may include the liver, colon, spleen, or pancreas. Blockage (obstruction) of the tube that carries urine from the kidney to the bladder (ureter). Failure of the kidney stone to break into fragments. What happens before the procedure? When to stop eating and drinking Follow instructions from your health care provider about what you may eat and drink. These may include: 8 hours before your procedure Stop eating most foods. Do not eat meat, fried foods, or fatty foods. Eat only light foods, such as toast or crackers. All liquids are okay except energy drinks and alcohol. 6 hours before your procedure Stop eating. Drink only clear liquids, such as water, clear fruit juice, black coffee, plain tea,  and sports drinks. Do not drink energy drinks or alcohol. 2 hours before your procedure Stop drinking all liquids. You may be allowed to take medicines with small sips of water. If you do not follow your health care provider's instructions, your procedure may be delayed or canceled. Medicines Ask your health care provider about: Changing or stopping your regular medicines. These include any diabetes medicines or blood thinners you take. Taking medicines such as aspirin and ibuprofen. These medicines can thin your blood. Do not take them unless your health care provider tells you to. Taking over-the-counter medicines, vitamins, herbs, and supplements. Tests You may have tests, such as: Blood tests. Urine tests. Imaging tests. This may include a CT scan. Surgery safety Ask your health care provider: How your surgery site will be marked. What steps will be taken to help prevent infection. These steps may include: Washing skin with a soap that kills germs. Receiving antibiotics. General instructions If you will be going home right after the procedure, plan to have a responsible adult: Take you home from the hospital or clinic. You will not be allowed to drive. Care for you for the time you are told. What happens during the procedure?  An IV will be inserted into one of your veins. You may be given: A sedative. This helps you relax. Anesthesia. This will: Numb certain areas of your body. Make you fall asleep for surgery. A water-filled cushion may be placed behind your kidney or on your abdomen. In some cases, you may be placed in a tub of lukewarm water. Your body will be positioned in a way that makes it   easier to target the kidney stone. An X-ray or ultrasound exam will be done to locate your stone. Shock waves will be aimed at the stone. If you are awake, you may feel a tapping sensation as the shock waves pass through your body. A small mesh tube (stent) may be placed in your  ureter. This will help keep urine flowing from the kidney if the fragments of the stone have been blocking the ureter. The stent will be removed at a later time by your health care provider. The procedure may vary among health care providers and hospitals. What happens after the procedure? Your blood pressure, heart rate, breathing rate, and blood oxygen level will be monitored until you leave the hospital or clinic. You may have an X-ray after the procedure to see how many of the kidney stones were broken up. This will also show how much of the stone has passed. If there are still large fragments after treatment, you may need to have a second procedure at a later time. This information is not intended to replace advice given to you by your health care provider. Make sure you discuss any questions you have with your health care provider. Document Revised: 08/18/2021 Document Reviewed: 08/18/2021 Elsevier Patient Education  2023 [employer]  

## 2022-08-18 NOTE — H&P (View-Only) (Signed)
 08/18/2022 10:19 AM   Jon Washington 01/05/1966 [id number]  Referring provider: Dixon, Phillip E, MD [address] [place],  [place] [address]  nephrolithiasis   HPI: No Jon Washington is a 56yo here for evaluation of nephrolithiasis. 10 days ago he developed right flank pain and was diagnosed with a 7mm right ureteral calculus.  He has not passed his calculus. This is his first stone events. No nausea or vomiting. KUB shows right proximal ureteral calculus.    PMH: Past Medical History:  Diagnosis Date   Chronic back pain    Colon cancer screening 06/13/2022   HLD (hyperlipidemia) 06/13/2016   Hypertension    Sleep apnea     Surgical History: Past Surgical History:  Procedure Laterality Date   APPENDECTOMY  1974   HERNIA REPAIR  1978    Home Medications:  Allergies as of 08/18/2022   No Known Allergies      Medication List        Accurate as of August 18, 2022 10:19 AM. If you have any questions, ask your nurse or doctor.          amoxicillin 500 MG capsule Commonly known as: AMOXIL Take 500 mg by mouth 4 (four) times daily.   atorvastatin 20 MG tablet Commonly known as: LIPITOR Take 1 tablet (20 mg total) by mouth daily.   lisinopril 10 MG tablet Commonly known as: ZESTRIL Take 1 tablet (10 mg total) by mouth daily.   metFORMIN 500 MG 24 hr tablet Commonly known as: GLUCOPHAGE-XR Take 2 tablets (1,000 mg total) by mouth 2 (two) times daily with a meal.   oxyCODONE-acetaminophen 5-325 MG tablet Commonly known as: PERCOCET/ROXICET Take 1 tablet by mouth every 6 (six) hours as needed for severe pain.   oxyCODONE-acetaminophen 5-325 MG tablet Commonly known as: Percocet Take 1-2 tablets by mouth every 4 (four) hours as needed.   tamsulosin 0.4 MG Caps capsule Commonly known as: FLOMAX Take 1 capsule (0.4 mg total) by mouth daily.   Vitamin D (Ergocalciferol) 1.25 MG (50000 UNIT) Caps capsule Commonly known as: DRISDOL Take 1 capsule  (50,000 Units total) by mouth every 7 (seven) days for 12 doses.        Allergies: No Known Allergies  Family History: Family History  Problem Relation Age of Onset   Thyroid disease Mother    Alcohol abuse Father    Arthritis Father    Cancer Father        lymphoma   COPD Father    Heart disease Father        irreg beat   Heart disease Sister 55       CAD   Hypertension Sister    Cancer Sister        hodgkins, breast, skin   Breast cancer Sister    Heart disease Maternal Uncle    Mental illness Maternal Grandmother        social anxiety   Heart disease Paternal Grandfather 55       heart attack    Social History:  reports that he quit smoking about 25 years ago. His smoking use included cigarettes. He started smoking about 42 years ago. He smoked an average of 1 pack per day. He has never used smokeless tobacco. He reports that he does not drink alcohol and does not use drugs.  ROS: All other review of systems were reviewed and are negative except what is noted above in HPI  Physical Exam: BP 136/80     Pulse 97   Constitutional:  Alert and oriented, No acute distress. HEENT: [place] AT, moist mucus membranes.  Trachea midline, no masses. Cardiovascular: No clubbing, cyanosis, or edema. Respiratory: Normal respiratory effort, no increased work of breathing. GI: Abdomen is soft, nontender, nondistended, no abdominal masses GU: No CVA tenderness.  Lymph: No cervical or inguinal lymphadenopathy. Skin: No rashes, bruises or suspicious lesions. Neurologic: Grossly intact, no focal deficits, moving all 4 extremities. Psychiatric: Normal mood and affect.  Laboratory Data: Lab Results  Component Value Date   WBC 17.5 (H) 08/10/2022   HGB 15.9 08/10/2022   HCT 47.0 08/10/2022   MCV 89.7 08/10/2022   PLT 254 08/10/2022    Lab Results  Component Value Date   CREATININE 1.37 (H) 08/10/2022    No results found for: "PSA"  No results found for: "TESTOSTERONE"  Lab  Results  Component Value Date   HGBA1C 7.4 (H) 06/09/2022    Urinalysis    Component Value Date/Time   COLORURINE YELLOW 08/10/2022 2340   APPEARANCEUR HAZY (A) 08/10/2022 2340   LABSPEC 1.020 08/10/2022 2340   PHURINE 6.0 08/10/2022 2340   GLUCOSEU >=500 (A) 08/10/2022 2340   HGBUR LARGE (A) 08/10/2022 2340   BILIRUBINUR NEGATIVE 08/10/2022 2340   KETONESUR 5 (A) 08/10/2022 2340   PROTEINUR 30 (A) 08/10/2022 2340   NITRITE NEGATIVE 08/10/2022 2340   LEUKOCYTESUR NEGATIVE 08/10/2022 2340    Lab Results  Component Value Date   LABMICR 5.6 06/09/2022   BACTERIA RARE (A) 08/10/2022    Pertinent Imaging: CT 4/12 and KUB today; Inages reviewed and discussed with the patient  No results found for this or any previous visit.  No results found for this or any previous visit.  No results found for this or any previous visit.  No results found for this or any previous visit.  No results found for this or any previous visit.  No valid procedures specified. No results found for this or any previous visit.  No results found for this or any previous visit.   Assessment & Plan:    1. Kidney stones -We discussed the management of kidney stones. These options include observation, ureteroscopy, shockwave lithotripsy (ESWL) and percutaneous nephrolithotomy (PCNL). We discussed which options are relevant to the patient's stone(s). We discussed the natural history of kidney stones as well as the complications of untreated stones and the impact on quality of life without treatment as well as with each of the above listed treatments. We also discussed the efficacy of each treatment in its ability to clear the stone burden. With any of these management options I discussed the signs and symptoms of infection and the need for emergent treatment should these be experienced. For each option we discussed the ability of each procedure to clear the patient of their stone burden.   For observation  I described the risks which include but are not limited to silent renal damage, life-threatening infection, need for emergent surgery, failure to pass stone and pain.   For ureteroscopy I described the risks which include bleeding, infection, damage to contiguous structures, positioning injury, ureteral stricture, ureteral avulsion, ureteral injury, need for prolonged ureteral stent, inability to perform ureteroscopy, need for an interval procedure, inability to clear stone burden, stent discomfort/pain, heart attack, stroke, pulmonary embolus and the inherent risks with general anesthesia.   For shockwave lithotripsy I described the risks which include arrhythmia, kidney contusion, kidney hemorrhage, need for transfusion, pain, inability to adequately break up stone, inability to pass   stone fragments, Steinstrasse, infection associated with obstructing stones, need for alternate surgical procedure, need for repeat shockwave lithotripsy, MI, CVA, PE and the inherent risks with anesthesia/conscious sedation.   For PCNL I described the risks including positioning injury, pneumothorax, hydrothorax, need for chest tube, inability to clear stone burden, renal laceration, arterial venous fistula or malformation, need for embolization of kidney, loss of kidney or renal function, need for repeat procedure, need for prolonged nephrostomy tube, ureteral avulsion, MI, CVA, PE and the inherent risks of general anesthesia.   - The patient would like to proceed with Right ESWL - Urinalysis, Routine w reflex microscopic   No follow-ups on file.  Teryl Mcconaghy, MD  [facility]   

## 2022-08-25 ENCOUNTER — Encounter (HOSPITAL_COMMUNITY)
Admission: RE | Admit: 2022-08-25 | Discharge: 2022-08-25 | Disposition: A | Discharge status: Home / Self Care | Payer: Managed Care, Other (non HMO) | Source: Ambulatory Visit | Attending: Urology | Admitting: Urology

## 2022-08-29 ENCOUNTER — Ambulatory Visit (HOSPITAL_COMMUNITY)
Admission: RE | Admit: 2022-08-29 | Discharge: 2022-08-29 | Disposition: A | Discharge status: Home / Self Care | Payer: Managed Care, Other (non HMO) | Attending: Urology | Admitting: Urology

## 2022-08-29 ENCOUNTER — Ambulatory Visit (HOSPITAL_COMMUNITY): Payer: Managed Care, Other (non HMO)

## 2022-08-29 ENCOUNTER — Encounter (HOSPITAL_COMMUNITY)
Admission: RE | Disposition: A | Discharge status: Home / Self Care | Payer: Self-pay | Source: Home / Self Care | Attending: Urology

## 2022-08-29 ENCOUNTER — Encounter (HOSPITAL_COMMUNITY): Payer: Self-pay | Admitting: Urology

## 2022-08-29 DIAGNOSIS — G4730 Sleep apnea, unspecified: Secondary | ICD-10-CM | POA: Insufficient documentation

## 2022-08-29 DIAGNOSIS — N201 Calculus of ureter: Secondary | ICD-10-CM | POA: Diagnosis present

## 2022-08-29 DIAGNOSIS — Z7984 Long term (current) use of oral hypoglycemic drugs: Secondary | ICD-10-CM | POA: Insufficient documentation

## 2022-08-29 DIAGNOSIS — E669 Obesity, unspecified: Secondary | ICD-10-CM | POA: Diagnosis not present

## 2022-08-29 DIAGNOSIS — I10 Essential (primary) hypertension: Secondary | ICD-10-CM | POA: Diagnosis not present

## 2022-08-29 DIAGNOSIS — E119 Type 2 diabetes mellitus without complications: Secondary | ICD-10-CM | POA: Insufficient documentation

## 2022-08-29 HISTORY — PX: EXTRACORPOREAL SHOCK WAVE LITHOTRIPSY: SHX1557

## 2022-08-29 SURGERY — LITHOTRIPSY, ESWL
Anesthesia: LOCAL | Laterality: Right

## 2022-08-29 MED ORDER — ONDANSETRON HCL 4 MG PO TABS: 4.0000 mg | ORAL_TABLET | Freq: Every day | ORAL | 1 refills | Status: DC | PRN

## 2022-08-29 MED ORDER — DIPHENHYDRAMINE HCL 25 MG PO CAPS
25.0000 mg | ORAL_CAPSULE | ORAL | Status: AC
  Administered 2022-08-29: 25 mg via ORAL
  Filled 2022-08-29: qty 1

## 2022-08-29 MED ORDER — TAMSULOSIN HCL 0.4 MG PO CAPS: 0.4000 mg | ORAL_CAPSULE | Freq: Every day | ORAL | 0 refills | Status: DC

## 2022-08-29 MED ORDER — DIAZEPAM 5 MG PO TABS
10.0000 mg | ORAL_TABLET | Freq: Once | ORAL | Status: AC
  Administered 2022-08-29: 10 mg via ORAL
  Filled 2022-08-29: qty 2

## 2022-08-29 MED ORDER — OXYCODONE-ACETAMINOPHEN 5-325 MG PO TABS: 1.0000 | ORAL_TABLET | ORAL | 0 refills | Status: DC | PRN

## 2022-08-29 NOTE — Interval H&P Note (Signed)
History and Physical Interval Note:  08/29/2022 8:30 AM  Jon Washington  has presented today for surgery, with the diagnosis of right ureteral calculus.  The various methods of treatment have been discussed with the patient and family. After consideration of risks, benefits and other options for treatment, the patient has consented to  Procedure(s): EXTRACORPOREAL SHOCK WAVE LITHOTRIPSY (ESWL) (Right) as a surgical intervention.  The patient's history has been reviewed, patient examined, no change in status, stable for surgery.  I have reviewed the patient's chart and labs.  Questions were answered to the patient's satisfaction.     Wilkie Aye

## 2022-08-30 ENCOUNTER — Encounter (HOSPITAL_COMMUNITY): Payer: Self-pay | Admitting: Urology

## 2022-09-01 ENCOUNTER — Other Ambulatory Visit (INDEPENDENT_AMBULATORY_CARE_PROVIDER_SITE_OTHER): Payer: Managed Care, Other (non HMO)

## 2022-09-01 ENCOUNTER — Ambulatory Visit: Payer: Managed Care, Other (non HMO) | Admitting: Orthopedic Surgery

## 2022-09-01 ENCOUNTER — Encounter: Payer: Self-pay | Admitting: Orthopedic Surgery

## 2022-09-01 VITALS — BP 119/90 | HR 82 | Ht 73.0 in | Wt 245.0 lb

## 2022-09-01 DIAGNOSIS — M722 Plantar fascial fibromatosis: Secondary | ICD-10-CM | POA: Diagnosis not present

## 2022-09-01 DIAGNOSIS — M79672 Pain in left foot: Secondary | ICD-10-CM | POA: Diagnosis not present

## 2022-09-01 NOTE — Progress Notes (Signed)
Orthopaedic Clinic Return  Assessment: Jon Washington is a 57 y.o. male with the following: Left foot plantar fasciitis  Plan: Mr. Deguzman has tenderness to palpation, and pain in the plantar left foot.  Presentation consistent with plantars fasciitis.  This is been ongoing for 2 weeks.  We discussed multiple treatment options including medications, topical treatments, no cups or inserts as well as an injection.  At this point, he would like to give it some more time.  He states if he continues to have issues, he can return for an injection.  I think this is reasonable.  I have also provided him with home exercise program to continue working on strengthening and stretching, although I do not feel his flexibility is a contributing factor.  Follow-up: Return if symptoms worsen or fail to improve.   Subjective:  Chief Complaint  Patient presents with   Foot Pain    L foot heel/arch pain for 2 wks. No injury but pt does walk a lot for work.     History of Present Illness: Jon Washington is a 57 y.o. male who returns to clinic for evaluation of left heel pain.  He states that pain in the left heel for the past 2 weeks.  No specific injury.  He does a lot of walking at work.  No twisting event.  He did not step on anything or bruise anything.  He does not remember feeling a pop.  He was concerned, and wanted to make sure there is nothing serious going on.  He does not take medications.  Has not tried inserts, or any particular rehab.  Review of Systems: No fevers or chills No numbness or tingling No chest pain No shortness of breath No bowel or bladder dysfunction No GI distress No headaches   Objective: BP (!) 119/90   Pulse 82   Ht 6\' 1"  (1.854 m)   Wt 245 lb (111.1 kg)   BMI 32.32 kg/m   Physical Exam:  Alert and oriented.  No acute distress.  Walks with a nonantalgic gait.  Foot appears normal.  No bruising.  No swelling.  No callosities.  He has tenderness  to palpation in the plantar heel.  He has very good flexibility, able to achieve 15 degrees of dorsiflexion with his leg straight.  This improves to 25 degrees with his knee bent.  Toes warm and well-perfused.  Sensation intact throughout the left foot.  IMAGING: I personally ordered and reviewed the following images:  X-rays of the left foot were obtained in clinic today.  No acute injuries are noted.  Normal mineralization.  No obvious deformity.  No evidence of pes planus.  There is a small bone spur on the plantar aspect of the calcaneus.  No injury in this area.  No bony lesions.  Impression: Negative left foot x-ray   Oliver Barre, MD 09/01/2022 8:56 AM

## 2022-09-01 NOTE — Patient Instructions (Signed)
Plantar Fasciitis Rehab Ask your health care provider which exercises are safe for you. Do exercises exactly as told by your health care provider and adjust them as directed. It is normal to feel mild stretching, pulling, tightness, or discomfort as you do these exercises. Stop right away if you feel sudden pain or your pain gets worse. Do not begin these exercises until told by your health care provider.   Stretching and range-of-motion exercises These exercises warm up your muscles and joints and improve the movement and flexibility of your foot. These exercises also help to relieve pain.   Plantar fascia stretch  Sit with your left / right leg crossed over your opposite knee. Hold your heel with one hand with that thumb near your arch. With your other hand, hold your toes and gently pull them back toward the top of your foot. You should feel a stretch on the base (bottom) of your toes, or the bottom of your foot (plantar fascia), or both. Hold this stretch 10 seconds. Slowly release your toes and return to the starting position. Repeat 10 times. Complete this exercise 1-2 times a day.   Gastrocnemius stretch, standing This exercise is also called a calf (gastroc) stretch. It stretches the muscles in the back of the upper calf. Stand with your hands against a wall. Extend your left / right leg behind you, and bend your front knee slightly. Keeping your heels on the floor, your toes facing forward, and your back knee straight, shift your weight toward the wall. Do not arch your back. You should feel a gentle stretch in your upper calf. Hold this position for 10 seconds. Repeat 10 times. Complete this exercise 1-2 times a day.   Soleus stretch, standing This exercise is also called a calf (soleus) stretch. It stretches the muscles in the back of the lower calf. Stand with your hands against a wall. Extend your left / right leg behind you, and bend your front knee slightly. Keeping your  heels on the floor and your toes facing forward, bend your back knee and shift your weight slightly over your back leg. You should feel a gentle stretch deep in your lower calf. Hold this position for 10 seconds. Repeat 10 times. Complete this exercise 1-2 times a day.   Gastroc and soleus stretch, standing step This exercise stretches the muscles in the back of the lower leg. These muscles are in the upper calf (gastrocnemius) and the lower calf (soleus). Stand with the ball of your left / right foot on the front of a step. The ball of your foot is on the walking surface, right under your toes. Keep your other foot firmly on the same step. Hold on to the wall or a railing for balance. Slowly lift your other foot, allowing your body weight to press your heel down over the edge of the front of the step. Keep knee straight and unbent. You should feel a stretch in your calf. Hold this position for 10 seconds. Return both feet to the step. Repeat this exercise with a slight bend in your left / right knee. Repeat 10 times with your left / right knee straight and 10 times with your left / right knee bent. Complete this exercise 1-2 times a day.   Balance exercise This exercise builds your balance and strength control of your arch to help take pressure off your plantar fascia.   Single leg stand If this exercise is too easy, you can try it with   your eyes closed or while standing on a pillow. Without shoes, stand near a railing or in a doorway. You may hold on to the railing or door frame as needed. Stand on your left / right foot. Keep your big toe down on the floor and lift the arch of your foot. You should feel a stretch across the bottom of your foot and your arch. Do not let your foot roll inward. Hold this position for 10 seconds. Repeat 10 times. Complete this exercise 1-2 times a day. This information is not intended to replace advice given to you by your health care provider. Make sure  you discuss any questions you have with your health care provider.   

## 2022-09-14 ENCOUNTER — Other Ambulatory Visit: Payer: Self-pay

## 2022-09-14 DIAGNOSIS — N200 Calculus of kidney: Secondary | ICD-10-CM

## 2022-09-15 ENCOUNTER — Ambulatory Visit (INDEPENDENT_AMBULATORY_CARE_PROVIDER_SITE_OTHER): Payer: Managed Care, Other (non HMO) | Admitting: Urology

## 2022-09-15 ENCOUNTER — Encounter: Payer: Self-pay | Admitting: Urology

## 2022-09-15 ENCOUNTER — Ambulatory Visit (HOSPITAL_COMMUNITY)
Admission: RE | Admit: 2022-09-15 | Discharge: 2022-09-15 | Disposition: A | Discharge status: Home / Self Care | Payer: Managed Care, Other (non HMO) | Source: Ambulatory Visit | Attending: Urology | Admitting: Urology

## 2022-09-15 VITALS — BP 125/77 | HR 78

## 2022-09-15 DIAGNOSIS — N200 Calculus of kidney: Secondary | ICD-10-CM | POA: Diagnosis present

## 2022-09-15 LAB — URINALYSIS, ROUTINE W REFLEX MICROSCOPIC
Bilirubin, UA: NEGATIVE
Glucose, UA: NEGATIVE
Ketones, UA: NEGATIVE
Leukocytes,UA: NEGATIVE
Nitrite, UA: NEGATIVE
Protein,UA: NEGATIVE
Specific Gravity, UA: 1.03 (ref 1.005–1.030)
Urobilinogen, Ur: 0.2 mg/dL (ref 0.2–1.0)
pH, UA: 5.5 (ref 5.0–7.5)

## 2022-09-15 LAB — MICROSCOPIC EXAMINATION
Bacteria, UA: NONE SEEN
WBC, UA: NONE SEEN /hpf (ref 0–5)

## 2022-09-15 NOTE — Progress Notes (Unsigned)
09/15/2022 9:05 AM   Jon Washington May 04, 1965 [id number]  Referring provider: Billie Lade, MD [address] [place],  [place] [address]  Chief Complaint  Patient presents with   Nephrolithiasis    HPI: Mr Jon Washington is a 57yo here for followup after ESWL. KUb shows no residual calculus. He brought fragments with him today. No flank pain.    PMH: Past Medical History:  Diagnosis Date   Chronic back pain    Colon cancer screening 06/13/2022   HLD (hyperlipidemia) 06/13/2016   Hypertension    Sleep apnea     Surgical History: Past Surgical History:  Procedure Laterality Date   APPENDECTOMY  1974   EXTRACORPOREAL SHOCK WAVE LITHOTRIPSY Right 08/29/2022   Procedure: EXTRACORPOREAL SHOCK WAVE LITHOTRIPSY (ESWL);  Surgeon: Malen Gauze, MD;  Location: [facility];  Service: Urology;  Laterality: Right;   HERNIA REPAIR  1978    Home Medications:  Allergies as of 09/15/2022   No Known Allergies      Medication List        Accurate as of Sep 15, 2022  9:05 AM. If you have any questions, ask your nurse or doctor.          STOP taking these medications    amoxicillin 500 MG capsule Commonly known as: AMOXIL Stopped by: Wilkie Aye, MD       TAKE these medications    atorvastatin 20 MG tablet Commonly known as: LIPITOR Take 1 tablet (20 mg total) by mouth daily.   lisinopril 10 MG tablet Commonly known as: ZESTRIL Take 1 tablet (10 mg total) by mouth daily.   metFORMIN 500 MG 24 hr tablet Commonly known as: GLUCOPHAGE-XR Take 2 tablets (1,000 mg total) by mouth 2 (two) times daily with a meal.   ondansetron 4 MG tablet Commonly known as: Zofran Take 1 tablet (4 mg total) by mouth daily as needed for nausea or vomiting.   oxyCODONE-acetaminophen 5-325 MG tablet Commonly known as: PERCOCET/ROXICET Take 1 tablet by mouth every 6 (six) hours as needed for severe pain.   oxyCODONE-acetaminophen 5-325 MG tablet Commonly known as:  Percocet Take 1-2 tablets by mouth every 4 (four) hours as needed.   tamsulosin 0.4 MG Caps capsule Commonly known as: FLOMAX Take 1 capsule (0.4 mg total) by mouth daily.        Allergies: No Known Allergies  Family History: Family History  Problem Relation Age of Onset   Thyroid disease Mother    Alcohol abuse Father    Arthritis Father    Cancer Father        lymphoma   COPD Father    Heart disease Father        irreg beat   Heart disease Sister 27       CAD   Hypertension Sister    Cancer Sister        hodgkins, breast, skin   Breast cancer Sister    Heart disease Maternal Uncle    Mental illness Maternal Grandmother        social anxiety   Heart disease Paternal Grandfather 86       heart attack    Social History:  reports that he quit smoking about 25 years ago. His smoking use included cigarettes. He started smoking about 42 years ago. He smoked an average of 1 pack per day. He has never used smokeless tobacco. He reports that he does not drink alcohol and does not use drugs.  ROS: All other review of systems were reviewed and are negative except what is noted above in HPI  Physical Exam: BP 125/77   Pulse 78   Constitutional:  Alert and oriented, No acute distress. HEENT: [place] AT, moist mucus membranes.  Trachea midline, no masses. Cardiovascular: No clubbing, cyanosis, or edema. Respiratory: Normal respiratory effort, no increased work of breathing. GI: Abdomen is soft, nontender, nondistended, no abdominal masses GU: No CVA tenderness.  Lymph: No cervical or inguinal lymphadenopathy. Skin: No rashes, bruises or suspicious lesions. Neurologic: Grossly intact, no focal deficits, moving all 4 extremities. Psychiatric: Normal mood and affect.  Laboratory Data: Lab Results  Component Value Date   WBC 17.5 (H) 08/10/2022   HGB 15.9 08/10/2022   HCT 47.0 08/10/2022   MCV 89.7 08/10/2022   PLT 254 08/10/2022    Lab Results  Component Value Date    CREATININE 1.37 (H) 08/10/2022    No results found for: "PSA"  No results found for: "TESTOSTERONE"  Lab Results  Component Value Date   HGBA1C 7.4 (H) 06/09/2022    Urinalysis    Component Value Date/Time   COLORURINE YELLOW 08/10/2022 2340   APPEARANCEUR Clear 08/18/2022 0954   LABSPEC 1.020 08/10/2022 2340   PHURINE 6.0 08/10/2022 2340   GLUCOSEU 3+ (A) 08/18/2022 0954   HGBUR LARGE (A) 08/10/2022 2340   BILIRUBINUR Negative 08/18/2022 0954   KETONESUR 5 (A) 08/10/2022 2340   PROTEINUR Negative 08/18/2022 0954   PROTEINUR 30 (A) 08/10/2022 2340   NITRITE Negative 08/18/2022 0954   NITRITE NEGATIVE 08/10/2022 2340   LEUKOCYTESUR Negative 08/18/2022 0954   LEUKOCYTESUR NEGATIVE 08/10/2022 2340    Lab Results  Component Value Date   LABMICR See below: 08/18/2022   WBCUA 0-5 08/18/2022   LABEPIT 0-10 08/18/2022   MUCUS Present 08/18/2022   BACTERIA None seen 08/18/2022    Pertinent Imaging: KUb today: Images reviewed and discussed with the patient  Results for orders placed during the hospital encounter of 08/29/22  DG Abd 1 View  Narrative CLINICAL DATA:  Ureteral calculi.  EXAM: ABDOMEN - 1 VIEW  COMPARISON:  August 18, 2022.  August 11, 2022.  FINDINGS: The bowel gas pattern is normal. Grossly stable position of probable distal right ureteral calculus at L4 level. Stable small left renal calculus.  IMPRESSION: Grossly stable position of probable distal right ureteral calculus.   Electronically Signed By: Lupita Raider M.D. On: 08/29/2022 08:02  No results found for this or any previous visit.  No results found for this or any previous visit.  No results found for this or any previous visit.  No results found for this or any previous visit.  No valid procedures specified. No results found for this or any previous visit.  No results found for this or any previous visit.   Assessment & Plan:    1. Kidney stones Followup 6 weeks with  renal US - Urinalysis, Routine w reflex microscopic   No follow-ups on file.  Wilkie Aye, MD  [facility] [place]

## 2022-09-15 NOTE — Patient Instructions (Signed)

## 2022-09-21 LAB — CALCULI, WITH PHOTOGRAPH (CLINICAL LAB)
Calcium Oxalate Dihydrate: 20 %
Calcium Oxalate Monohydrate: 80 %
Weight Calculi: 44 mg

## 2022-10-10 ENCOUNTER — Other Ambulatory Visit: Payer: Self-pay | Admitting: Internal Medicine

## 2022-10-10 DIAGNOSIS — I152 Hypertension secondary to endocrine disorders: Secondary | ICD-10-CM

## 2022-10-19 ENCOUNTER — Ambulatory Visit (INDEPENDENT_AMBULATORY_CARE_PROVIDER_SITE_OTHER): Payer: Managed Care, Other (non HMO) | Admitting: Internal Medicine

## 2022-10-19 ENCOUNTER — Encounter: Payer: Self-pay | Admitting: Internal Medicine

## 2022-10-19 VITALS — BP 133/76 | HR 87 | Ht 73.0 in | Wt 237.4 lb

## 2022-10-19 DIAGNOSIS — E119 Type 2 diabetes mellitus without complications: Secondary | ICD-10-CM

## 2022-10-19 DIAGNOSIS — I152 Hypertension secondary to endocrine disorders: Secondary | ICD-10-CM

## 2022-10-19 DIAGNOSIS — E782 Mixed hyperlipidemia: Secondary | ICD-10-CM | POA: Diagnosis not present

## 2022-10-19 DIAGNOSIS — E559 Vitamin D deficiency, unspecified: Secondary | ICD-10-CM

## 2022-10-19 DIAGNOSIS — E1159 Type 2 diabetes mellitus with other circulatory complications: Secondary | ICD-10-CM | POA: Diagnosis not present

## 2022-10-19 NOTE — Progress Notes (Deleted)
History of Present Illness: Jon Washington is a 57 y.o. male who presents today for follow up visit at [facility] [place]. - GU history: 1. Kidney stones. - 08/29/2022: Underwent right ESWL by Dr. Ronne Binning. Stone analysis showed 100% calcium oxalate composition.  At last visit with Dr. Ronne Binning on 09/15/2022: - Follow up s/p ESWL. No stones seen on KUB. - The plan was followup 6 weeks with renal US.   Since last visit: RUS on *** showed ***  Today: He reports ***  He {Actions; denies-reports:120008} recent episode of stone pain/ passage. He {Actions; denies-reports:120008} acute flank pain / abdominal pain.  He {Actions; denies-reports:120008} fevers. He {Actions; denies-reports:120008} nausea/ vomiting.   Fall Screening: Do you usually have a device to assist in your mobility? {yes/no:20286} ***cane / ***walker / ***wheelchair   Medications: Current Outpatient Medications  Medication Sig Dispense Refill   atorvastatin (LIPITOR) 20 MG tablet Take 1 tablet (20 mg total) by mouth daily. 90 tablet 3   lisinopril (ZESTRIL) 10 MG tablet Take 1 tablet by mouth once daily 90 tablet 0   metFORMIN (GLUCOPHAGE-XR) 500 MG 24 hr tablet Take 2 tablets (1,000 mg total) by mouth 2 (two) times daily with a meal. 360 tablet 1   ondansetron (ZOFRAN) 4 MG tablet Take 1 tablet (4 mg total) by mouth daily as needed for nausea or vomiting. 30 tablet 1   oxyCODONE-acetaminophen (PERCOCET) 5-325 MG tablet Take 1-2 tablets by mouth every 4 (four) hours as needed. 30 tablet 0   oxyCODONE-acetaminophen (PERCOCET/ROXICET) 5-325 MG tablet Take 1 tablet by mouth every 6 (six) hours as needed for severe pain. 6 tablet 0   tamsulosin (FLOMAX) 0.4 MG CAPS capsule Take 1 capsule (0.4 mg total) by mouth daily. 30 capsule 0   No current facility-administered medications for this visit.    Allergies: No Known Allergies  Past Medical History:  Diagnosis Date   Chronic back pain    Colon cancer  screening 06/13/2022   HLD (hyperlipidemia) 06/13/2016   Hypertension    Sleep apnea    Past Surgical History:  Procedure Laterality Date   APPENDECTOMY  1974   EXTRACORPOREAL SHOCK WAVE LITHOTRIPSY Right 08/29/2022   Procedure: EXTRACORPOREAL SHOCK WAVE LITHOTRIPSY (ESWL);  Surgeon: Malen Gauze, MD;  Location: [facility];  Service: Urology;  Laterality: Right;   HERNIA REPAIR  1978   Family History  Problem Relation Age of Onset   Thyroid disease Mother    Alcohol abuse Father    Arthritis Father    Cancer Father        lymphoma   COPD Father    Heart disease Father        irreg beat   Heart disease Sister 58       CAD   Hypertension Sister    Cancer Sister        hodgkins, breast, skin   Breast cancer Sister    Heart disease Maternal Uncle    Mental illness Maternal Grandmother        social anxiety   Heart disease Paternal Grandfather 76       heart attack   Social History   Socioeconomic History   Marital status: Married    Spouse name: Marisue Ivan   Number of children: 3   Years of education: 12   Highest education level: 12th grade  Occupational History   Occupation: Holiday representative  Tobacco Use   Smoking status: Former    Packs/day: 1  Types: Cigarettes    Start date: 05/01/1980    Quit date: 05/01/1997    Years since quitting: 25.4   Smokeless tobacco: Never  Vaping Use   Vaping Use: Former   Devices: started in 1982 quit in 1994  Substance and Sexual Activity   Alcohol use: No   Drug use: No   Sexual activity: Yes    Birth control/protection: None    Comment: wife PCOS  Other Topics Concern   Not on file  Social History Narrative   Married for 14 years.Wife Marisue Ivan   Lives with wife and daughter Bonnee Quin age 10, and 5 dogs. All step daughters   Thomasene Ripple - [employer] 6 years.   Pipe fitter.   Caffeine: diet soda 8-10 a week   Right handed   Social Determinants of Health   Financial Resource Strain: Low Risk  (10/15/2022)   Overall Financial Resource Strain  (CARDIA)    Difficulty of Paying Living Expenses: Not very hard  Food Insecurity: No Food Insecurity (10/15/2022)   Hunger Vital Sign    Worried About Running Out of Food in the Last Year: Never true    Ran Out of Food in the Last Year: Never true  Transportation Needs: No Transportation Needs (10/15/2022)   PRAPARE - Administrator, Civil Service (Medical): No    Lack of Transportation (Non-Medical): No  Physical Activity: Sufficiently Active (10/15/2022)   Exercise Vital Sign    Days of Exercise per Week: 5 days    Minutes of Exercise per Session: 60 min  Stress: No Stress Concern Present (10/15/2022)   [employer] of Occupational Health - Occupational Stress Questionnaire    Feeling of Stress : Not at all  Social Connections: Moderately Isolated (10/15/2022)   Social Connection and Isolation Panel [NHANES]    Frequency of Communication with Friends and Family: Three times a week    Frequency of Social Gatherings with Friends and Family: Once a week    Attends Religious Services: Never    Database administrator or Organizations: No    Attends Engineer, structural: Not on file    Marital Status: Married  Catering manager Violence: Not on file    Review of Systems Constitutional: Patient ***denies any unintentional weight loss or change in strength lntegumentary: Patient ***denies any rashes or pruritus Eyes: Patient denies ***dry eyes ENT: Patient ***denies dry mouth Cardiovascular: Patient ***denies chest pain or syncope Respiratory: Patient ***denies shortness of breath Gastrointestinal: Patient ***denies nausea, vomiting, constipation, or diarrhea Musculoskeletal: Patient ***denies muscle cramps or weakness Neurologic: Patient ***denies convulsions or seizures Psychiatric: Patient ***denies memory problems Allergic/Immunologic: Patient ***denies recent allergic reaction(s) Hematologic/Lymphatic: Patient denies bleeding tendencies Endocrine: Patient  ***denies heat/cold intolerance  GU: As per HPI.  OBJECTIVE There were no vitals filed for this visit. There is no height or weight on file to calculate BMI.  Physical Examination  Constitutional: ***No obvious distress; patient is ***non-toxic appearing  Cardiovascular: ***No visible lower extremity edema.  Respiratory: The patient does ***not have audible wheezing/stridor; respirations do ***not appear labored  Gastrointestinal: Abdomen ***non-distended Musculoskeletal: ***Normal ROM of UEs  Skin: ***No obvious rashes/open sores  Neurologic: CN 2-12 grossly ***intact Psychiatric: Answered questions ***appropriately with ***normal affect  Hematologic/Lymphatic/Immunologic: ***No obvious bruises or sites of spontaneous bleeding  UA: {Desc; negative/positive:13464} *** WBC/hpf, *** RBC/hpf, bacteria (***) *** nitrites, *** leukocytes, *** blood PVR: *** ml  ASSESSMENT No diagnosis found. ***  Will plan for follow up in ***months / ***1  year or sooner if needed. Pt verbalized understanding and agreement. All questions were answered.  PLAN Advised the following: 1. *** 2. ***No follow-ups on file.  No orders of the defined types were placed in this encounter.   It has been explained that the patient is to follow regularly with their PCP in addition to all other providers involved in their care and to follow instructions provided by these respective offices. Patient advised to contact urology clinic if any urologic-pertaining questions, concerns, new symptoms or problems arise in the interim period.  There are no Patient Instructions on file for this visit.  Electronically signed by:  Donnita Falls, FNP   10/19/22    10:45 AM

## 2022-10-19 NOTE — Assessment & Plan Note (Signed)
BP remains adequately controlled with lisinopril.  No medication changes are indicated today.

## 2022-10-19 NOTE — Patient Instructions (Signed)
It was a pleasure to see you today.  Thank you for giving [place] the opportunity to be involved in your care.  Below is a brief recap of your visit and next steps.  We will plan to see you again in 4 months.  Summary No medication changes today Continue to focus on healthy dietary practices to lower your blood sugar Repeat cholesterol panel and vitamin D level Follow up in 4 months for annual physical exam

## 2022-10-19 NOTE — Assessment & Plan Note (Signed)
He is currently prescribed atorvastatin 20 mg daily. -Repeat lipid panel ordered today 

## 2022-10-19 NOTE — Assessment & Plan Note (Signed)
POC A1c is 7.3 today.  He is currently prescribed metformin XR 1000 mg twice daily. -No medication changes today. Jon Washington acknowledges that there is room for significant improvement in his diet.  He would prefer to focus on dietary changes and not add additional medication at this time.  If his A1c remains > 7 at follow-up, he would like to consider GLP-1 therapy. -Diabetic eye exam recently completed

## 2022-10-19 NOTE — Assessment & Plan Note (Signed)
Noted on previous labs.  He has completed high-dose, weekly vitamin D supplementation. -Repeat vitamin D level ordered today 

## 2022-10-19 NOTE — Progress Notes (Signed)
Established Patient Office Visit  Subjective   Patient ID: Jon Washington, male    DOB: 1965/09/11  Age: 57 y.o. MRN: [id number]  Chief Complaint  Patient presents with   Diabetes    Follow up   Jon Washington for routine follow-up.  Last evaluated by me on 3/14.  No medication changes were made at that time and 87-month follow-up was arranged.  In the interim he presented to the emergency department on 4/11 and was subsequently found to have kidney stones.  Underwent ESWL with Dr. Ronne Binning on 4/30.  He has also been evaluated by orthopedic surgery for plantar fasciitis.  There have otherwise been no acute interval events.  Jon Washington.  He is asymptomatic and has no acute concerns to discuss.  Past Medical History:  Diagnosis Date   Chronic back pain    Colon cancer screening 06/13/2022   HLD (hyperlipidemia) 06/13/2016   Hypertension    Sleep apnea    Past Surgical History:  Procedure Laterality Date   APPENDECTOMY  1974   EXTRACORPOREAL SHOCK WAVE LITHOTRIPSY Right 08/29/2022   Procedure: EXTRACORPOREAL SHOCK WAVE LITHOTRIPSY (ESWL);  Surgeon: Malen Gauze, MD;  Location: [facility];  Service: Urology;  Laterality: Right;   HERNIA REPAIR  1978   Social History   Tobacco Use   Smoking status: Former    Packs/day: 1    Types: Cigarettes    Start date: 05/01/1980    Quit date: 05/01/1997    Years since quitting: 25.4   Smokeless tobacco: Never  Vaping Use   Vaping Use: Former   Devices: started in 1982 quit in 1994  Substance Use Topics   Alcohol use: No   Drug use: No   Family History  Problem Relation Age of Onset   Thyroid disease Mother    Alcohol abuse Father    Arthritis Father    Cancer Father        lymphoma   COPD Father    Heart disease Father        irreg beat   Heart disease Sister 33       CAD   Hypertension Sister    Cancer Sister        hodgkins, breast, skin   Breast cancer Sister    Heart  disease Maternal Uncle    Mental illness Maternal Grandmother        social anxiety   Heart disease Paternal Grandfather 42       heart attack   No Known Allergies  Review of Systems  Constitutional:  Negative for chills and fever.  HENT:  Negative for sore throat.   Respiratory:  Negative for cough and shortness of breath.   Cardiovascular:  Negative for chest pain, palpitations and leg swelling.  Gastrointestinal:  Negative for abdominal pain, blood in stool, constipation, diarrhea, nausea and vomiting.  Genitourinary:  Negative for dysuria and hematuria.  Musculoskeletal:  Negative for myalgias.  Skin:  Negative for itching and rash.  Neurological:  Negative for dizziness and headaches.  Psychiatric/Behavioral:  Negative for depression and suicidal ideas.      Objective:     BP 133/76   Pulse 87   Ht 6\' 1"  (1.854 m)   Wt 237 lb 6.4 oz (107.7 kg)   SpO2 95%   BMI 31.32 kg/m  BP Readings from Last 3 Encounters:  10/19/22 133/76  09/15/22 125/77  09/01/22 (!) 119/90   Physical Exam Vitals  reviewed.  Constitutional:      General: He is not in acute distress.    Appearance: Normal appearance. He is obese. He is not ill-appearing.  HENT:     Head: Normocephalic and atraumatic.     Right Ear: External ear normal.     Left Ear: External ear normal.     Nose: Nose normal. No congestion or rhinorrhea.     Mouth/Throat:     Mouth: Mucous membranes are moist.     Pharynx: Oropharynx is clear.  Eyes:     General: No scleral icterus.    Extraocular Movements: Extraocular movements intact.     Conjunctiva/sclera: Conjunctivae normal.     Pupils: Pupils are equal, round, and reactive to light.  Cardiovascular:     Rate and Rhythm: Normal rate and regular rhythm.     Pulses: Normal pulses.     Heart sounds: Normal heart sounds. No murmur heard. Pulmonary:     Effort: Pulmonary effort is normal.     Breath sounds: Normal breath sounds. No wheezing, rhonchi or rales.   Abdominal:     General: Abdomen is flat. Bowel sounds are normal. There is no distension.     Palpations: Abdomen is soft.     Tenderness: There is no abdominal tenderness.  Musculoskeletal:        General: No swelling or deformity. Normal range of motion.     Cervical back: Normal range of motion.  Skin:    General: Skin is warm and dry.     Capillary Refill: Capillary refill takes less than 2 seconds.  Neurological:     General: No focal deficit present.     Mental Status: He is alert and oriented to person, place, and time.     Motor: No weakness.  Psychiatric:        Mood and Affect: Mood normal.        Behavior: Behavior normal.        Thought Content: Thought content normal.   Last CBC Lab Results  Component Value Date   WBC 17.5 (H) 08/10/2022   HGB 15.9 08/10/2022   HCT 47.0 08/10/2022   MCV 89.7 08/10/2022   MCH 30.3 08/10/2022   RDW 12.6 08/10/2022   PLT 254 08/10/2022   Last metabolic panel Lab Results  Component Value Date   GLUCOSE 214 (H) 08/10/2022   NA 135 08/10/2022   K 4.5 08/10/2022   CL 99 08/10/2022   CO2 25 08/10/2022   BUN 18 08/10/2022   CREATININE 1.37 (H) 08/10/2022   GFRNONAA >60 08/10/2022   CALCIUM 9.4 08/10/2022   PROT 7.9 08/10/2022   ALBUMIN 4.3 08/10/2022   LABGLOB 2.5 06/09/2022   AGRATIO 1.8 06/09/2022   BILITOT 0.7 08/10/2022   ALKPHOS 70 08/10/2022   AST 22 08/10/2022   ALT 19 08/10/2022   ANIONGAP 11 08/10/2022   Last lipids Lab Results  Component Value Date   CHOL 203 (H) 06/09/2022   HDL 47 06/09/2022   LDLCALC 135 (H) 06/09/2022   TRIG 119 06/09/2022   CHOLHDL 4.3 06/09/2022   Last hemoglobin A1c Lab Results  Component Value Date   HGBA1C 7.4 (H) 06/09/2022   Last thyroid functions Lab Results  Component Value Date   TSH 1.210 06/09/2022   Last vitamin D Lab Results  Component Value Date   VD25OH 12.3 (L) 06/09/2022   Last vitamin B12 and Folate Lab Results  Component Value Date   VITAMINB12 325  06/09/2022  FOLATE 8.8 06/09/2022   The 10-year ASCVD risk score (Arnett DK, et al., 2019) is: 16.5%    Assessment & Plan:   Problem List Items Addressed This Visit       Hypertension associated with diabetes (HCC)    BP remains adequately controlled with lisinopril.  No medication changes are indicated Washington.      Type 2 diabetes mellitus without complications (HCC) - Primary    POC A1c is 7.3 Washington.  He is currently prescribed metformin XR 1000 mg twice daily. -No medication changes Washington. Mr. Engberg acknowledges that there is room for significant improvement in his diet.  He would prefer to focus on dietary changes and not add additional medication at this time.  If his A1c remains > 7 at follow-up, he would like to consider GLP-1 therapy. -Diabetic eye exam recently completed      HLD (hyperlipidemia)    He is currently prescribed atorvastatin 20 mg daily.  Repeat lipid panel ordered Washington.      Vitamin D deficiency    Noted on previous labs.  He has completed high-dose, weekly vitamin D supplementation.  Repeat vitamin D level ordered Washington.       Return in about 4 months (around 02/18/2023) for CPE.    Billie Lade, MD

## 2022-10-20 ENCOUNTER — Other Ambulatory Visit: Payer: Self-pay | Admitting: Internal Medicine

## 2022-10-20 DIAGNOSIS — E782 Mixed hyperlipidemia: Secondary | ICD-10-CM

## 2022-10-20 LAB — LIPID PANEL
Chol/HDL Ratio: 4 ratio (ref 0.0–5.0)
Cholesterol, Total: 170 mg/dL (ref 100–199)
HDL: 43 mg/dL (ref >39)
LDL Chol Calc (NIH): 95 mg/dL (ref 0–99)
Triglycerides: 187 mg/dL — ABNORMAL HIGH (ref 0–149)
VLDL Cholesterol Cal: 32 mg/dL (ref 5–40)

## 2022-10-20 LAB — VITAMIN D 25 HYDROXY (VIT D DEFICIENCY, FRACTURES): Vit D, 25-Hydroxy: 27.2 ng/mL — ABNORMAL LOW (ref 30.0–100.0)

## 2022-10-20 MED ORDER — ATORVASTATIN CALCIUM 40 MG PO TABS
40.0000 mg | ORAL_TABLET | Freq: Every day | ORAL | 3 refills | Status: DC
Start: 2022-10-20 — End: 2023-11-08

## 2022-10-24 ENCOUNTER — Ambulatory Visit: Payer: Managed Care, Other (non HMO) | Admitting: Urology

## 2022-11-06 ENCOUNTER — Ambulatory Visit (HOSPITAL_COMMUNITY)
Admission: RE | Admit: 2022-11-06 | Discharge: 2022-11-06 | Disposition: A | Discharge status: Home / Self Care | Payer: Managed Care, Other (non HMO) | Source: Ambulatory Visit | Attending: Urology | Admitting: Urology

## 2022-11-06 DIAGNOSIS — N200 Calculus of kidney: Secondary | ICD-10-CM | POA: Diagnosis not present

## 2022-11-14 ENCOUNTER — Ambulatory Visit: Payer: Managed Care, Other (non HMO) | Admitting: Urology

## 2022-11-14 ENCOUNTER — Ambulatory Visit
Admission: EM | Admit: 2022-11-14 | Discharge: 2022-11-14 | Disposition: A | Discharge status: Home / Self Care | Payer: Managed Care, Other (non HMO) | Attending: Nurse Practitioner | Admitting: Nurse Practitioner

## 2022-11-14 DIAGNOSIS — H00022 Hordeolum internum right lower eyelid: Secondary | ICD-10-CM

## 2022-11-14 DIAGNOSIS — N200 Calculus of kidney: Secondary | ICD-10-CM

## 2022-11-14 MED ORDER — DOXYCYCLINE HYCLATE 100 MG PO TABS: 100.0000 mg | ORAL_TABLET | Freq: Two times a day (BID) | ORAL | 0 refills | Status: AC

## 2022-11-14 NOTE — Discharge Instructions (Addendum)
Take medication as prescribed. May take OTC Ibuprofen or Tylenol as needed for pain or discomfort. Warm compresses to the right eye 3 to 4 times daily while symptoms persist. Keep the right eye clean and dry. Do not try to pop or drain the stye or rub the eye.  If the area becomes larges or begins to obstruct your vision, please follow-up with your eye doctor as soon as possible. Follow-up as needed.

## 2022-11-14 NOTE — ED Provider Notes (Signed)
[facility]    CSN: [id number] Arrival date & time: 11/14/22  1212      History   Chief Complaint No chief complaint on file.   HPI Jon Washington is a 57 y.o. male.   The history is provided by the patient.   The patient presents for complaints of swelling to the right lower eyelid with redness and pain.  Patient states symptoms have been present for the past 4 days.  Patient denies fever, chills, visual changes, eye pain, eye redness, or itching of the eye.  Patient states he has had a history of styes.  He states that he has not taken any medication for his symptoms.  Patient reports that he wears eyeglasses as needed.  Past Medical History:  Diagnosis Date   Chronic back pain    Colon cancer screening 06/13/2022   HLD (hyperlipidemia) 06/13/2016   Hypertension    Sleep apnea     Patient Active Problem List   Diagnosis Date Noted   Hypertension associated with diabetes (HCC) 05/02/2021   Obstructive sleep apnea 03/22/2021   Large tonsils 11/10/2020   Solar keratosis 06/30/2016   Type 2 diabetes mellitus without complications (HCC) 06/13/2016   HLD (hyperlipidemia) 06/13/2016   Vitamin D deficiency 06/13/2016   Family history of heart disease in male family member before age 73 06/12/2016   Obesity (BMI 30-39.9) 06/12/2016    Past Surgical History:  Procedure Laterality Date   APPENDECTOMY  1974   EXTRACORPOREAL SHOCK WAVE LITHOTRIPSY Right 08/29/2022   Procedure: EXTRACORPOREAL SHOCK WAVE LITHOTRIPSY (ESWL);  Surgeon: Malen Gauze, MD;  Location: [facility];  Service: Urology;  Laterality: Right;   HERNIA REPAIR  1978       Home Medications    Prior to Admission medications   Medication Sig Start Date End Date Taking? Authorizing Provider  doxycycline (VIBRA-TABS) 100 MG tablet Take 1 tablet (100 mg total) by mouth 2 (two) times daily for 5 days. 11/14/22 11/19/22 Yes Karter Hellmer-Warren, Sadie Haber, NP  atorvastatin (LIPITOR) 40 MG tablet Take  1 tablet (40 mg total) by mouth daily. 10/20/22   Billie Lade, MD  lisinopril (ZESTRIL) 10 MG tablet Take 1 tablet by mouth once daily 10/10/22   Billie Lade, MD  metFORMIN (GLUCOPHAGE-XR) 500 MG 24 hr tablet Take 2 tablets (1,000 mg total) by mouth 2 (two) times daily with a meal. 07/13/22 01/09/23  Billie Lade, MD    Family History Family History  Problem Relation Age of Onset   Thyroid disease Mother    Alcohol abuse Father    Arthritis Father    Cancer Father        lymphoma   COPD Father    Heart disease Father        irreg beat   Heart disease Sister 39       CAD   Hypertension Sister    Cancer Sister        hodgkins, breast, skin   Breast cancer Sister    Heart disease Maternal Uncle    Mental illness Maternal Grandmother        social anxiety   Heart disease Paternal Grandfather 12       heart attack    Social History Social History   Tobacco Use   Smoking status: Former    Current packs/day: 0.00    Average packs/day: 1 pack/day for 17.0 years (17.0 ttl pk-yrs)    Types: Cigarettes    Start date: 05/01/1980  Quit date: 05/01/1997    Years since quitting: 25.5   Smokeless tobacco: Never  Vaping Use   Vaping status: Former   Devices: started in 1982 quit in 1994  Substance Use Topics   Alcohol use: No   Drug use: No     Allergies   Patient has no known allergies.   Review of Systems Review of Systems Per HPI  Physical Exam Triage Vital Signs ED Triage Vitals  Encounter Vitals Group     BP 11/14/22 1240 112/74     Systolic BP Percentile --      Diastolic BP Percentile --      Pulse Rate 11/14/22 1240 (!) 103     Resp 11/14/22 1240 16     Temp 11/14/22 1240 98.3 F (36.8 C)     Temp Source 11/14/22 1240 Oral     SpO2 11/14/22 1240 95 %     Weight --      Height --      Head Circumference --      Peak Flow --      Pain Score 11/14/22 1242 3     Pain Loc --      Pain Education --      Exclude from Growth Chart --    No data  found.  Updated Vital Signs BP 112/74 (BP Location: Right Arm)   Pulse (!) 103   Temp 98.3 F (36.8 C) (Oral)   Resp 16   SpO2 95%   Visual Acuity Right Eye Distance:   Left Eye Distance:   Bilateral Distance:    Right Eye Near:   Left Eye Near:    Bilateral Near:     Physical Exam Vitals and nursing note reviewed.  Constitutional:      General: He is not in acute distress.    Appearance: Normal appearance.  HENT:     Head: Normocephalic.  Eyes:     General: Vision grossly intact.        Right eye: Hordeolum (Right lower eyelid) present. No foreign body or discharge.     Extraocular Movements: Extraocular movements intact.     Right eye: Normal extraocular motion and no nystagmus.     Conjunctiva/sclera:     Right eye: Right conjunctiva is not injected. No chemosis, exudate or hemorrhage.    Pupils: Pupils are equal, round, and reactive to light.   Pulmonary:     Effort: Pulmonary effort is normal.  Musculoskeletal:     Cervical back: Normal range of motion.  Neurological:     General: No focal deficit present.     Mental Status: He is alert and oriented to person, place, and time.  Psychiatric:        Mood and Affect: Mood normal.        Behavior: Behavior normal.      UC Treatments / Results  Labs (all labs ordered are listed, but only abnormal results are displayed) Labs Reviewed - No data to display  EKG   Radiology No results found.  Procedures Procedures (including critical care time)  Medications Ordered in UC Medications - No data to display  Initial Impression / Assessment and Plan / UC Course  I have reviewed the triage vital signs and the nursing notes.  Pertinent labs & imaging results that were available during my care of the patient were reviewed by me and considered in my medical decision making (see chart for details).  The patient is well-appearing, he is in  no acute distress, vital signs are stable.  Symptoms consistent with  internal hordeolum of the right lower lid.  Will treat empirically with doxycycline 100 mg twice daily for the next 5 days.  Supportive care recommendations were provided and discussed with the patient to include use of over-the-counter analgesics for pain or discomfort, warm compresses to the eye, and keeping the eye clean and dry without popping or rubbing the eye.  Patient was advised to follow-up with his eye doctor if the area becomes larger and begins to obstruct his vision.  Patient is in agreement with this plan of care and verbalizes understanding.  All questions were answered.  Patient stable for discharge.  Final Clinical Impressions(s) / UC Diagnoses   Final diagnoses:  Hordeolum internum of right lower eyelid     Discharge Instructions      Take medication as prescribed. May take OTC Ibuprofen or Tylenol as needed for pain or discomfort. Warm compresses to the right eye 3 to 4 times daily while symptoms persist. Keep the right eye clean and dry. Do not try to pop or drain the stye or rub the eye.  If the area becomes larges or begins to obstruct your vision, please follow-up with your eye doctor as soon as possible. Follow-up as needed.      ED Prescriptions     Medication Sig Dispense Auth. Provider   doxycycline (VIBRA-TABS) 100 MG tablet Take 1 tablet (100 mg total) by mouth 2 (two) times daily for 5 days. 10 tablet Tyjai Matuszak-Warren, Sadie Haber, NP      PDMP not reviewed this encounter.   Abran Cantor, NP 11/14/22 1256

## 2022-11-14 NOTE — ED Triage Notes (Signed)
Pt c/o eye problem right eye bottom eye lid is swollen, with redness, pain started on Thursday

## 2023-02-01 LAB — HM DIABETES EYE EXAM

## 2023-02-07 ENCOUNTER — Other Ambulatory Visit: Payer: Self-pay | Admitting: Internal Medicine

## 2023-02-07 DIAGNOSIS — E1159 Type 2 diabetes mellitus with other circulatory complications: Secondary | ICD-10-CM

## 2023-02-19 ENCOUNTER — Encounter: Payer: Self-pay | Admitting: Internal Medicine

## 2023-02-19 ENCOUNTER — Ambulatory Visit (INDEPENDENT_AMBULATORY_CARE_PROVIDER_SITE_OTHER): Payer: Managed Care, Other (non HMO) | Admitting: Internal Medicine

## 2023-02-19 VITALS — BP 115/73 | HR 97 | Ht 73.0 in | Wt 241.6 lb

## 2023-02-19 DIAGNOSIS — E785 Hyperlipidemia, unspecified: Secondary | ICD-10-CM

## 2023-02-19 DIAGNOSIS — E1159 Type 2 diabetes mellitus with other circulatory complications: Secondary | ICD-10-CM

## 2023-02-19 DIAGNOSIS — G4733 Obstructive sleep apnea (adult) (pediatric): Secondary | ICD-10-CM | POA: Diagnosis not present

## 2023-02-19 DIAGNOSIS — E119 Type 2 diabetes mellitus without complications: Secondary | ICD-10-CM | POA: Diagnosis not present

## 2023-02-19 DIAGNOSIS — I152 Hypertension secondary to endocrine disorders: Secondary | ICD-10-CM

## 2023-02-19 NOTE — Assessment & Plan Note (Signed)
A1c 7.4 on labs from February.  He was previously prescribed metformin XR 1000 mg twice daily but states that he has not been taking it recently. -Due for repeat A1c today.  Consider GLP-1 therapy if A1c remains above goal.

## 2023-02-19 NOTE — Assessment & Plan Note (Signed)
Remains well-controlled with lisinopril 10 mg daily.  No medication changes are indicated today.

## 2023-02-19 NOTE — Progress Notes (Signed)
Complete physical exam  Patient: Jon Washington   DOB: 1966/02/07   57 y.o. Male  MRN: [id number]  Subjective:    Chief Complaint  Patient presents with   Annual Exam    Jon Washington is a 57 y.o. male who presents today for a complete physical exam. He reports consuming a general diet. The patient has a physically strenuous job, but has no regular exercise apart from work.  He generally feels well. He reports sleeping fairly well. He does not have additional problems to discuss today.    Most recent fall risk assessment:    10/19/2022    3:34 PM  Fall Risk   Falls in the past year? 0  Number falls in past yr: 0  Injury with Fall? 0  Risk for fall due to : No Fall Risks  Follow up Falls evaluation completed     Most recent depression screenings:    02/19/2023    3:33 PM 10/19/2022    3:34 PM  PHQ 2/9 Scores  PHQ - 2 Score 0 0  PHQ- 9 Score 1 1    Vision:Within last year and Dental: No current dental problems and Receives regular dental care  Past Medical History:  Diagnosis Date   Chronic back pain    Colon cancer screening 06/13/2022   HLD (hyperlipidemia) 06/13/2016   Hypertension    Sleep apnea    Past Surgical History:  Procedure Laterality Date   APPENDECTOMY  1974   EXTRACORPOREAL SHOCK WAVE LITHOTRIPSY Right 08/29/2022   Procedure: EXTRACORPOREAL SHOCK WAVE LITHOTRIPSY (ESWL);  Surgeon: Malen Gauze, MD;  Location: [facility];  Service: Urology;  Laterality: Right;   HERNIA REPAIR  1978   Social History   Tobacco Use   Smoking status: Former    Current packs/day: 0.00    Average packs/day: 1 pack/day for 17.0 years (17.0 ttl pk-yrs)    Types: Cigarettes    Start date: 05/01/1980    Quit date: 05/01/1997    Years since quitting: 25.8   Smokeless tobacco: Never  Vaping Use   Vaping status: Former   Devices: started in 1982 quit in 1994  Substance Use Topics   Alcohol use: No   Drug use: No   Family History  Problem Relation Age of  Onset   Thyroid disease Mother    Alcohol abuse Father    Arthritis Father    Cancer Father        lymphoma   COPD Father    Heart disease Father        irreg beat   Heart disease Sister 65       CAD   Hypertension Sister    Cancer Sister        hodgkins, breast, skin   Breast cancer Sister    Heart disease Maternal Uncle    Mental illness Maternal Grandmother        social anxiety   Heart disease Paternal Grandfather 50       heart attack   No Known Allergies    Patient Care Team: Billie Lade, MD as PCP - General (Internal Medicine) Conley Rolls, Westley Chandler, MD as Referring Physician (Optometry)   Outpatient Medications Prior to Visit  Medication Sig   atorvastatin (LIPITOR) 40 MG tablet Take 1 tablet (40 mg total) by mouth daily.   ibuprofen (ADVIL) 800 MG tablet Take 800 mg by mouth every 8 (eight) hours as needed.   lisinopril (ZESTRIL) 10 MG tablet Take 1  tablet by mouth once daily   [DISCONTINUED] amoxicillin (AMOXIL) 500 MG capsule Take 500 mg by mouth 4 (four) times daily.   [DISCONTINUED] metFORMIN (GLUCOPHAGE-XR) 500 MG 24 hr tablet Take 2 tablets (1,000 mg total) by mouth 2 (two) times daily with a meal.   No facility-administered medications prior to visit.   Review of Systems  Constitutional:  Negative for chills and fever.  HENT:  Negative for sore throat.   Respiratory:  Negative for cough and shortness of breath.   Cardiovascular:  Negative for chest pain, palpitations and leg swelling.  Gastrointestinal:  Negative for abdominal pain, blood in stool, constipation, diarrhea, nausea and vomiting.  Genitourinary:  Negative for dysuria and hematuria.  Musculoskeletal:  Negative for myalgias.  Skin:  Negative for itching and rash.  Neurological:  Negative for dizziness and headaches.  Psychiatric/Behavioral:  Negative for depression and suicidal ideas.       Objective:     BP 115/73   Pulse 97   Ht 6\' 1"  (1.854 m)   Wt 241 lb 9.6 oz (109.6 kg)   SpO2  96%   BMI 31.88 kg/m  BP Readings from Last 3 Encounters:  02/19/23 115/73  11/14/22 112/74  10/19/22 133/76   Physical Exam Vitals reviewed.  Constitutional:      General: He is not in acute distress.    Appearance: Normal appearance. He is obese. He is not ill-appearing.  HENT:     Head: Normocephalic and atraumatic.     Right Ear: External ear normal.     Left Ear: External ear normal.     Nose: Nose normal. No congestion or rhinorrhea.     Mouth/Throat:     Mouth: Mucous membranes are moist.     Pharynx: Oropharynx is clear.  Eyes:     General: No scleral icterus.    Extraocular Movements: Extraocular movements intact.     Conjunctiva/sclera: Conjunctivae normal.     Pupils: Pupils are equal, round, and reactive to light.  Cardiovascular:     Rate and Rhythm: Normal rate and regular rhythm.     Pulses: Normal pulses.     Heart sounds: Normal heart sounds. No murmur heard. Pulmonary:     Effort: Pulmonary effort is normal.     Breath sounds: Normal breath sounds. No wheezing, rhonchi or rales.  Abdominal:     General: Abdomen is flat. Bowel sounds are normal. There is no distension.     Palpations: Abdomen is soft.     Tenderness: There is no abdominal tenderness.  Musculoskeletal:        General: No swelling or deformity. Normal range of motion.     Cervical back: Normal range of motion.  Skin:    General: Skin is warm and dry.     Capillary Refill: Capillary refill takes less than 2 seconds.  Neurological:     General: No focal deficit present.     Mental Status: He is alert and oriented to person, place, and time.     Motor: No weakness.  Psychiatric:        Mood and Affect: Mood normal.        Behavior: Behavior normal.        Thought Content: Thought content normal.   Last CBC Lab Results  Component Value Date   WBC 17.5 (H) 08/10/2022   HGB 15.9 08/10/2022   HCT 47.0 08/10/2022   MCV 89.7 08/10/2022   MCH 30.3 08/10/2022   RDW 12.6 08/10/2022  PLT 254 08/10/2022   Last metabolic panel Lab Results  Component Value Date   GLUCOSE 214 (H) 08/10/2022   NA 135 08/10/2022   K 4.5 08/10/2022   CL 99 08/10/2022   CO2 25 08/10/2022   BUN 18 08/10/2022   CREATININE 1.37 (H) 08/10/2022   GFRNONAA >60 08/10/2022   CALCIUM 9.4 08/10/2022   PROT 7.9 08/10/2022   ALBUMIN 4.3 08/10/2022   LABGLOB 2.5 06/09/2022   AGRATIO 1.8 06/09/2022   BILITOT 0.7 08/10/2022   ALKPHOS 70 08/10/2022   AST 22 08/10/2022   ALT 19 08/10/2022   ANIONGAP 11 08/10/2022   Last lipids Lab Results  Component Value Date   CHOL 170 10/19/2022   HDL 43 10/19/2022   LDLCALC 95 10/19/2022   TRIG 187 (H) 10/19/2022   CHOLHDL 4.0 10/19/2022   Last hemoglobin A1c Lab Results  Component Value Date   HGBA1C 7.4 (H) 06/09/2022   Last thyroid functions Lab Results  Component Value Date   TSH 1.210 06/09/2022   Last vitamin D Lab Results  Component Value Date   VD25OH 27.2 (L) 10/19/2022   Last vitamin B12 and Folate Lab Results  Component Value Date   VITAMINB12 325 06/09/2022   FOLATE 8.8 06/09/2022      Assessment & Plan:    Routine Health Maintenance and Physical Exam  Immunization History  Administered Date(s) Administered   Influenza,inj,Quad PF,6+ Mos 01/23/2017, 03/08/2021, 06/13/2022   Influenza-Unspecified 04/02/2020   Moderna SARS-COV2 Booster Vaccination 03/01/2021   Moderna Sars-Covid-2 Vaccination 07/09/2019, 08/06/2019, 04/05/2020   Tdap 08/25/2020    Health Maintenance  Topic Date Due   Zoster Vaccines- Shingrix (1 of 2) Never done   INFLUENZA VACCINE  11/30/2022   HEMOGLOBIN A1C  12/08/2022   COVID-19 Vaccine (4 - 2023-24 season) 12/31/2022   Diabetic kidney evaluation - Urine ACR  06/10/2023   FOOT EXAM  07/13/2023   Diabetic kidney evaluation - eGFR measurement  08/10/2023   OPHTHALMOLOGY EXAM  02/01/2024   Fecal DNA (Cologuard)  06/26/2025   DTaP/Tdap/Td (2 - Td or Tdap) 08/26/2030   Hepatitis C Screening   Completed   HIV Screening  Completed   HPV VACCINES  Aged Out    Discussed health benefits of physical activity, and encouraged him to engage in regular exercise appropriate for his age and condition.  Problem List Items Addressed This Visit       Hypertension associated with diabetes (HCC)    Remains well-controlled with lisinopril 10 mg daily.  No medication changes are indicated today.      Obstructive sleep apnea    Severe OSA noted on HST from August 2022.  He is not compliant with CPAP and is interested in an inspire device. -Sleep medicine referral placed today for management of OSA and candidacy evaluation for inspire      Type 2 diabetes mellitus without complications (HCC) - Primary    A1c 7.4 on labs from February.  He was previously prescribed metformin XR 1000 mg twice daily but states that he has not been taking it recently. -Due for repeat A1c today.  Consider GLP-1 therapy if A1c remains above goal.      HLD (hyperlipidemia)    Lipid panel updated in June.  Total cholesterol 170 and LDL 95.  He is currently prescribed atorvastatin 40 mg daily.  This was increased in light of his lipid panel result from June. -Repeat lipid panel ordered today      Return in about 6  months (around 08/20/2023).  Billie Lade, MD

## 2023-02-19 NOTE — Assessment & Plan Note (Signed)
Lipid panel updated in June.  Total cholesterol 170 and LDL 95.  He is currently prescribed atorvastatin 40 mg daily.  This was increased in light of his lipid panel result from June. -Repeat lipid panel ordered today

## 2023-02-19 NOTE — Patient Instructions (Signed)
It was a pleasure to see you today.  Thank you for giving [place] the opportunity to be involved in your care.  Below is a brief recap of your visit and next steps.  We will plan to see you again in 6 months.  Summary Annual physical completed today Repeat labs Flu shot today Follow up in 6 months

## 2023-02-19 NOTE — Assessment & Plan Note (Signed)
Severe OSA noted on HST from August 2022.  He is not compliant with CPAP and is interested in an inspire device. -Sleep medicine referral placed today for management of OSA and candidacy evaluation for inspire

## 2023-02-22 ENCOUNTER — Encounter: Payer: Self-pay | Admitting: Internal Medicine

## 2023-02-22 DIAGNOSIS — E119 Type 2 diabetes mellitus without complications: Secondary | ICD-10-CM

## 2023-02-22 DIAGNOSIS — E669 Obesity, unspecified: Secondary | ICD-10-CM

## 2023-02-22 LAB — LIPID PANEL
Chol/HDL Ratio: 2.9 ratio (ref 0.0–5.0)
Cholesterol, Total: 129 mg/dL (ref 100–199)
HDL: 44 mg/dL (ref >39)
LDL Chol Calc (NIH): 68 mg/dL (ref 0–99)
Triglycerides: 87 mg/dL (ref 0–149)
VLDL Cholesterol Cal: 17 mg/dL (ref 5–40)

## 2023-02-22 LAB — HEMOGLOBIN A1C
Est. average glucose Bld gHb Est-mCnc: 186 mg/dL
Hgb A1c MFr Bld: 8.1 % — ABNORMAL HIGH (ref 4.8–5.6)

## 2023-02-22 MED ORDER — TIRZEPATIDE 2.5 MG/0.5ML ~~LOC~~ SOAJ
2.5000 mg | SUBCUTANEOUS | 0 refills | Status: DC
Start: 2023-02-22 — End: 2023-03-08

## 2023-03-08 ENCOUNTER — Encounter: Payer: Self-pay | Admitting: Internal Medicine

## 2023-03-08 DIAGNOSIS — E119 Type 2 diabetes mellitus without complications: Secondary | ICD-10-CM

## 2023-03-08 MED ORDER — TIRZEPATIDE 5 MG/0.5ML ~~LOC~~ SOAJ
5.0000 mg | SUBCUTANEOUS | 1 refills | Status: DC
Start: 2023-03-08 — End: 2023-04-09

## 2023-03-16 ENCOUNTER — Encounter: Payer: Self-pay | Admitting: Internal Medicine

## 2023-03-28 ENCOUNTER — Encounter: Payer: Self-pay | Admitting: Internal Medicine

## 2023-04-09 ENCOUNTER — Encounter: Payer: Self-pay | Admitting: Internal Medicine

## 2023-04-09 DIAGNOSIS — E119 Type 2 diabetes mellitus without complications: Secondary | ICD-10-CM

## 2023-04-09 MED ORDER — TIRZEPATIDE 7.5 MG/0.5ML ~~LOC~~ SOAJ
7.5000 mg | SUBCUTANEOUS | 0 refills | Status: AC
Start: 2023-04-09 — End: 2023-05-07

## 2023-05-08 ENCOUNTER — Encounter: Payer: Self-pay | Admitting: Internal Medicine

## 2023-05-08 DIAGNOSIS — E119 Type 2 diabetes mellitus without complications: Secondary | ICD-10-CM

## 2023-05-08 MED ORDER — TIRZEPATIDE 7.5 MG/0.5ML ~~LOC~~ SOAJ
7.5000 mg | SUBCUTANEOUS | 0 refills | Status: AC
Start: 2023-05-08 — End: 2023-06-05

## 2023-05-19 ENCOUNTER — Other Ambulatory Visit: Payer: Self-pay | Admitting: Internal Medicine

## 2023-05-19 DIAGNOSIS — I152 Hypertension secondary to endocrine disorders: Secondary | ICD-10-CM

## 2023-05-25 ENCOUNTER — Encounter: Payer: Self-pay | Admitting: Internal Medicine

## 2023-05-25 ENCOUNTER — Other Ambulatory Visit: Payer: Self-pay | Admitting: Internal Medicine

## 2023-05-25 DIAGNOSIS — E119 Type 2 diabetes mellitus without complications: Secondary | ICD-10-CM

## 2023-05-26 ENCOUNTER — Encounter: Payer: Self-pay | Admitting: Internal Medicine

## 2023-05-26 LAB — HEMOGLOBIN A1C
Est. average glucose Bld gHb Est-mCnc: 126 mg/dL
Hgb A1c MFr Bld: 6 % — ABNORMAL HIGH (ref 4.8–5.6)

## 2023-06-08 ENCOUNTER — Encounter: Payer: Self-pay | Admitting: Internal Medicine

## 2023-06-08 DIAGNOSIS — E119 Type 2 diabetes mellitus without complications: Secondary | ICD-10-CM

## 2023-06-08 MED ORDER — TIRZEPATIDE 7.5 MG/0.5ML ~~LOC~~ SOAJ: 7.5000 mg | SUBCUTANEOUS | 1 refills | Status: DC

## 2023-06-11 ENCOUNTER — Emergency Department (HOSPITAL_COMMUNITY): Payer: Managed Care, Other (non HMO)

## 2023-06-11 ENCOUNTER — Other Ambulatory Visit: Payer: Self-pay

## 2023-06-11 ENCOUNTER — Emergency Department (HOSPITAL_COMMUNITY)
Admission: EM | Admit: 2023-06-11 | Discharge: 2023-06-11 | Disposition: A | Discharge status: Home / Self Care | Payer: Managed Care, Other (non HMO) | Attending: Emergency Medicine | Admitting: Emergency Medicine

## 2023-06-11 ENCOUNTER — Encounter (HOSPITAL_COMMUNITY): Payer: Self-pay

## 2023-06-11 DIAGNOSIS — Z79899 Other long term (current) drug therapy: Secondary | ICD-10-CM | POA: Insufficient documentation

## 2023-06-11 DIAGNOSIS — N23 Unspecified renal colic: Secondary | ICD-10-CM

## 2023-06-11 DIAGNOSIS — I10 Essential (primary) hypertension: Secondary | ICD-10-CM | POA: Diagnosis not present

## 2023-06-11 DIAGNOSIS — E119 Type 2 diabetes mellitus without complications: Secondary | ICD-10-CM | POA: Diagnosis not present

## 2023-06-11 DIAGNOSIS — R112 Nausea with vomiting, unspecified: Secondary | ICD-10-CM | POA: Diagnosis present

## 2023-06-11 DIAGNOSIS — N132 Hydronephrosis with renal and ureteral calculous obstruction: Secondary | ICD-10-CM | POA: Insufficient documentation

## 2023-06-11 LAB — COMPREHENSIVE METABOLIC PANEL
ALT: 11 U/L (ref 0–44)
AST: 18 U/L (ref 15–41)
Albumin: 4.6 g/dL (ref 3.5–5.0)
Alkaline Phosphatase: 55 U/L (ref 38–126)
Anion gap: 14 (ref 5–15)
BUN: 16 mg/dL (ref 6–20)
CO2: 21 mmol/L — ABNORMAL LOW (ref 22–32)
Calcium: 10.1 mg/dL (ref 8.9–10.3)
Chloride: 103 mmol/L (ref 98–111)
Creatinine, Ser: 1.15 mg/dL (ref 0.61–1.24)
GFR, Estimated: >60 mL/min (ref >60)
Glucose, Bld: 172 mg/dL — ABNORMAL HIGH (ref 70–99)
Potassium: 3.9 mmol/L (ref 3.5–5.1)
Sodium: 138 mmol/L (ref 135–145)
Total Bilirubin: 0.8 mg/dL (ref 0.0–1.2)
Total Protein: 8.1 g/dL (ref 6.5–8.1)

## 2023-06-11 LAB — URINALYSIS, ROUTINE W REFLEX MICROSCOPIC
Bacteria, UA: NONE SEEN
Bilirubin Urine: NEGATIVE
Glucose, UA: NEGATIVE mg/dL
Ketones, ur: 5 mg/dL — AB
Leukocytes,Ua: NEGATIVE
Nitrite: NEGATIVE
Protein, ur: 30 mg/dL — AB
RBC / HPF: >50 RBC/hpf (ref 0–5)
Specific Gravity, Urine: >1.046 — ABNORMAL HIGH (ref 1.005–1.030)
pH: 5 (ref 5.0–8.0)

## 2023-06-11 LAB — CBC
HCT: 46 % (ref 39.0–52.0)
Hemoglobin: 15.5 g/dL (ref 13.0–17.0)
MCH: 30.2 pg (ref 26.0–34.0)
MCHC: 33.7 g/dL (ref 30.0–36.0)
MCV: 89.7 fL (ref 80.0–100.0)
Platelets: 288 10*3/uL (ref 150–400)
RBC: 5.13 MIL/uL (ref 4.22–5.81)
RDW: 13 % (ref 11.5–15.5)
WBC: 16.7 10*3/uL — ABNORMAL HIGH (ref 4.0–10.5)
nRBC: 0 % (ref 0.0–0.2)

## 2023-06-11 LAB — LIPASE, BLOOD: Lipase: 24 U/L (ref 11–51)

## 2023-06-11 LAB — CBG MONITORING, ED: Glucose-Capillary: 145 mg/dL — ABNORMAL HIGH (ref 70–99)

## 2023-06-11 MED ORDER — PANTOPRAZOLE SODIUM 40 MG IV SOLR
80.0000 mg | Freq: Once | INTRAVENOUS | Status: AC
  Administered 2023-06-11: 80 mg via INTRAVENOUS
  Filled 2023-06-11: qty 20

## 2023-06-11 MED ORDER — ONDANSETRON HCL 4 MG/2ML IJ SOLN
4.0000 mg | Freq: Once | INTRAMUSCULAR | Status: AC
  Administered 2023-06-11: 4 mg via INTRAVENOUS
  Filled 2023-06-11: qty 2

## 2023-06-11 MED ORDER — IOHEXOL 300 MG/ML  SOLN
100.0000 mL | Freq: Once | INTRAMUSCULAR | Status: AC | PRN
  Administered 2023-06-11: 100 mL via INTRAVENOUS

## 2023-06-11 MED ORDER — TAMSULOSIN HCL 0.4 MG PO CAPS: 0.4000 mg | ORAL_CAPSULE | Freq: Every day | ORAL | 0 refills | Status: DC

## 2023-06-11 MED ORDER — LIDOCAINE VISCOUS HCL 2 % MT SOLN
10.0000 mL | Freq: Once | OROMUCOSAL | Status: AC
  Administered 2023-06-11: 10 mL via OROMUCOSAL
  Filled 2023-06-11: qty 15

## 2023-06-11 MED ORDER — ONDANSETRON 8 MG PO TBDP: 8.0000 mg | ORAL_TABLET | Freq: Three times a day (TID) | ORAL | 0 refills | Status: AC | PRN

## 2023-06-11 MED ORDER — ACETAMINOPHEN 500 MG PO TABS: 500.0000 mg | ORAL_TABLET | Freq: Four times a day (QID) | ORAL | 0 refills | Status: AC | PRN

## 2023-06-11 MED ORDER — ONDANSETRON HCL 4 MG/2ML IJ SOLN
INTRAMUSCULAR | Status: AC
  Administered 2023-06-11: 4 mg via INTRAVENOUS
  Filled 2023-06-11: qty 2

## 2023-06-11 MED ORDER — OXYCODONE-ACETAMINOPHEN 5-325 MG PO TABS: 1.0000 | ORAL_TABLET | Freq: Three times a day (TID) | ORAL | 0 refills | Status: AC | PRN

## 2023-06-11 MED ORDER — LIDOCAINE VISCOUS HCL 2 % MT SOLN: 15.0000 mL | Freq: Four times a day (QID) | OROMUCOSAL | 0 refills | Status: AC | PRN

## 2023-06-11 MED ORDER — ONDANSETRON HCL 4 MG/2ML IJ SOLN: 4.0000 mg | Freq: Once | INTRAMUSCULAR | Status: AC | PRN

## 2023-06-11 MED ORDER — IBUPROFEN 600 MG PO TABS: 600.0000 mg | ORAL_TABLET | Freq: Four times a day (QID) | ORAL | 0 refills | Status: AC | PRN

## 2023-06-11 MED ORDER — MAGIC MOUTHWASH W/LIDOCAINE: 10.0000 mL | Freq: Once | ORAL | Status: DC

## 2023-06-11 MED ORDER — KETOROLAC TROMETHAMINE 15 MG/ML IJ SOLN
15.0000 mg | Freq: Once | INTRAMUSCULAR | Status: AC
  Administered 2023-06-11: 15 mg via INTRAVENOUS
  Filled 2023-06-11: qty 1

## 2023-06-11 MED ORDER — MORPHINE SULFATE (PF) 4 MG/ML IV SOLN
4.0000 mg | Freq: Once | INTRAVENOUS | Status: AC
  Administered 2023-06-11: 4 mg via INTRAVENOUS
  Filled 2023-06-11: qty 1

## 2023-06-11 NOTE — ED Provider Notes (Signed)
 [place] EMERGENCY DEPARTMENT AT [facility] Provider Note   CSN: [id number] Arrival date & time: 06/11/23  2130     History  Chief Complaint  Patient presents with   Emesis    Jon Washington is a 58 y.o. male.  HPI    58 year old male comes in with chief complaint of vomiting.  Patient has history of diabetes and is taking Mounjaro for that.  He also has history of hyperlipidemia and hypertension and remote history of kidney stones.  Patient started having mild abdominal discomfort yesterday.  In the middle the night, he started having severe nausea and vomiting.  He is having right-sided abdominal pain that is 8 out of 10.  He has previous history of appendectomy.  In addition to abdominal pain, patient has had 10+ episodes of emesis since 2 AM.  Per wife, she has noticed some streaks of blood in it.  Patient admits to occasional marijuana use.  No history of heavy alcohol use or tobacco use disorder.  No known history of kidney stones.  He has been on the same dose of Mounjaro for the last 2 months.   Home Medications Prior to Admission medications   Medication Sig Start Date End Date Taking? Authorizing Provider  acetaminophen  (TYLENOL ) 500 MG tablet Take 1 tablet (500 mg total) by mouth every 6 (six) hours as needed. 06/11/23  Yes Azalea Cedar, Gae Jointer, MD  atorvastatin  (LIPITOR) 40 MG tablet Take 1 tablet (40 mg total) by mouth daily. 10/20/22  Yes Tobi Fortes, MD  ibuprofen  (ADVIL ) 600 MG tablet Take 1 tablet (600 mg total) by mouth every 6 (six) hours as needed. 06/11/23  Yes Deatra Face, MD  lidocaine  (XYLOCAINE ) 2 % solution Use as directed 15 mLs in the mouth or throat every 6 (six) hours as needed for mouth pain. 06/11/23  Yes Deatra Face, MD  lisinopril  (ZESTRIL ) 10 MG tablet Take 1 tablet by mouth once daily 05/21/23  Yes Dixon, Phillip E, MD  ondansetron  (ZOFRAN -ODT) 8 MG disintegrating tablet Take 1 tablet (8 mg total) by mouth every 8 (eight) hours  as needed for nausea. 06/11/23  Yes Deatra Face, MD  oxyCODONE -acetaminophen  (PERCOCET/ROXICET) 5-325 MG tablet Take 1 tablet by mouth every 8 (eight) hours as needed for severe pain (pain score 7-10). 06/11/23  Yes Deatra Face, MD  tamsulosin  (FLOMAX ) 0.4 MG CAPS capsule Take 1 capsule (0.4 mg total) by mouth daily. 06/11/23  Yes Kaysia Willard, MD  tirzepatide [facility]) 7.5 MG/0.5ML Pen Inject 7.5 mg into the skin once a week. Patient not taking: Reported on 06/11/2023 06/08/23   Tobi Fortes, MD      Allergies    Patient has no known allergies.    Review of Systems   Review of Systems  All other systems reviewed and are negative.   Physical Exam Updated Vital Signs BP 127/71   Pulse 90   Temp 98.2 F (36.8 C) (Oral)   Resp 20   Ht 6\' 1"  (1.854 m)   Wt 89.1 kg   SpO2 98%   BMI 25.91 kg/m  Physical Exam Vitals and nursing note reviewed.  Constitutional:      Appearance: He is well-developed. He is ill-appearing and diaphoretic.  HENT:     Head: Atraumatic.  Cardiovascular:     Rate and Rhythm: Normal rate.  Pulmonary:     Effort: Pulmonary effort is normal.  Abdominal:     Tenderness: There is abdominal tenderness. There is no guarding or rebound.  Musculoskeletal:     Cervical back: Neck supple.  Skin:    General: Skin is warm.  Neurological:     Mental Status: He is alert and oriented to person, place, and time.     ED Results / Procedures / Treatments   Labs (all labs ordered are listed, but only abnormal results are displayed) Labs Reviewed  COMPREHENSIVE METABOLIC PANEL - Abnormal; Notable for the following components:      Result Value   CO2 21 (*)    Glucose, Bld 172 (*)    All other components within normal limits  CBC - Abnormal; Notable for the following components:   WBC 16.7 (*)    All other components within normal limits  URINALYSIS, ROUTINE W REFLEX MICROSCOPIC - Abnormal; Notable for the following components:   APPearance HAZY (*)     Specific Gravity, Urine >1.046 (*)    Hgb urine dipstick LARGE (*)    Ketones, ur 5 (*)    Protein, ur 30 (*)    All other components within normal limits  CBG MONITORING, ED - Abnormal; Notable for the following components:   Glucose-Capillary 145 (*)    All other components within normal limits  LIPASE, BLOOD    EKG EKG Interpretation Date/Time:  Monday June 11 2023 07:23:15 EST Ventricular Rate:  70 PR Interval:  52 QRS Duration:  102 QT Interval:  409 QTC Calculation: 442 R Axis:   63  Text Interpretation: Sinus rhythm Short PR interval No acute changes No significant change since last tracing Confirmed by Deatra Face [phone]) on 06/11/2023 9:23:36 AM  Radiology CT ABDOMEN PELVIS W CONTRAST Result Date: 06/11/2023 CLINICAL DATA:  Abdominal pain, acute, nonlocalized.  Vomiting. EXAM: CT ABDOMEN AND PELVIS WITH CONTRAST TECHNIQUE: Multidetector CT imaging of the abdomen and pelvis was performed using the standard protocol following bolus administration of intravenous contrast. RADIATION DOSE REDUCTION: This exam was performed according to the departmental dose-optimization program which includes automated exposure control, adjustment of the mA and/or kV according to patient size and/or use of iterative reconstruction technique. CONTRAST:  OMNIPAQUE  IOHEXOL  300 MG/ML  SOLN COMPARISON:  CT abdomen/pelvis dated August 11, 2022. FINDINGS: Lower chest: No acute abnormality. Hepatobiliary: No focal liver abnormality is seen. No gallstones, gallbladder wall thickening, or biliary dilatation. Pancreas: Unremarkable. No pancreatic ductal dilatation or surrounding inflammatory changes. Spleen: Normal in size.  Calcified splenic granulomas. Adrenals/Urinary Tract: Adrenal glands are unremarkable. There is a 4 mm calculus at the level of the mid left ureter with mild to moderate left hydroureteronephrosis and perinephric stranding. There is a nonobstructive 4 mm calculus at the  interpolar right kidney. No right-sided hydronephrosis. Bladder is unremarkable. Stomach/Bowel: Stomach is within normal limits. Status post appendectomy. No evidence of bowel wall thickening, distention, or inflammatory changes. Vascular/Lymphatic: The abdominal aorta is normal in caliber. Aortic atherosclerosis. No enlarged abdominal or pelvic lymph nodes. Reproductive: Prostate is unremarkable. Other: No abdominopelvic ascites. No intraperitoneal free air. Small fat containing umbilical hernia. Musculoskeletal: No acute osseous abnormality. No suspicious osseous lesion. IMPRESSION: 4 mm mid left ureteral calculus with mild-to-moderate left hydroureteronephrosis. Aortic Atherosclerosis (ICD10-I70.0). Electronically Signed   By: Mannie Seek M.D.   On: 06/11/2023 10:04    Procedures Procedures    Medications Ordered in ED Medications  magic mouthwash w/lidocaine  (has no administration in time range)  ondansetron  (ZOFRAN ) injection 4 mg (4 mg Intravenous Given 06/11/23 0743)  morphine  (PF) 4 MG/ML injection 4 mg (4 mg Intravenous Given 06/11/23 0936)  pantoprazole  (  PROTONIX ) injection 80 mg (80 mg Intravenous Given 06/11/23 0837)  iohexol  (OMNIPAQUE ) 300 MG/ML solution 100 mL (100 mLs Intravenous Contrast Given 06/11/23 0846)  ondansetron  (ZOFRAN ) injection 4 mg (4 mg Intravenous Given 06/11/23 0935)  ketorolac  (TORADOL ) 15 MG/ML injection 15 mg (15 mg Intravenous Given 06/11/23 1100)    ED Course/ Medical Decision Making/ A&P Clinical Course as of 06/11/23 1216  Mon Jun 11, 2023  1014 CT ABDOMEN PELVIS W CONTRAST [AN]  1101 Po challenge initiated.  [AN]    Clinical Course User Index [AN] Deatra Face, MD                                 Medical Decision Making Amount and/or Complexity of Data Reviewed Labs: ordered. Radiology: ordered. Decision-making details documented in ED Course.  Risk Prescription drug management.   This patient presents to the ED with chief complaint(s)  of abdominal pain, nausea and several episodes of emesis with pertinent past medical history of appendectomy, kidney stones, diabetes on Mounjaro.The complaint involves an extensive differential diagnosis and also carries with it a high risk of complications and morbidity.    The differential diagnosis includes : Ureteral colic, small bowel obstruction, peptic ulcer disease, perforated viscus, intra-abdominal infection, medication side effects  The initial plan is to get basic labs.  Will proceed with CT abdomen and pelvis with contrast.   Additional history obtained: Additional history obtained from spouse  Independent labs interpretation:  The following labs were independently interpreted: Patient has elevated white count.  Blood sugar is slightly elevated, no evidence of DKA.  Creatinine is at baseline normal  Independent visualization and interpretation of imaging: - I independently visualized the following imaging with scope of interpretation limited to determining acute life threatening conditions related to emergency care: CT scan, which revealed distended bowel.  Concerns for possibly early small bowel obstruction clinically along with kidney stone.   CT is read.  Patient has a 4 mm kidney stone.  No evidence of bowel obstruction.  There is mild hydronephrosis noted.  Treatment and Reassessment: Patient reassessed after the CT scan findings.  P.o. challenge initiated.  Toradol  ordered. Patient complains of pain over his throat, as if something is stuck in there.  Likely esophagitis, from severe retching and vomiting he had earlier.  P.o. challenge initiated.  At 12:15 PM: Pt reassessed. Pt's VSS and WNL. Pt's cap refill < 3 seconds. Pt has been hydrated in the ER and now passed po challenge. We will discharge with antiemetic. Strict ER return precautions have been discussed and pt will return if he is unable to tolerate fluids and symptoms are getting worse. Final Clinical  Impression(s) / ED Diagnoses Final diagnoses:  Ureteral colic    Rx / DC Orders ED Discharge Orders          Ordered    ondansetron  (ZOFRAN -ODT) 8 MG disintegrating tablet  Every 8 hours PRN        06/11/23 1215    ibuprofen  (ADVIL ) 600 MG tablet  Every 6 hours PRN        06/11/23 1215    acetaminophen  (TYLENOL ) 500 MG tablet  Every 6 hours PRN        06/11/23 1215    oxyCODONE -acetaminophen  (PERCOCET/ROXICET) 5-325 MG tablet  Every 8 hours PRN        06/11/23 1215    tamsulosin  (FLOMAX ) 0.4 MG CAPS capsule  Daily  06/11/23 1215    lidocaine  (XYLOCAINE ) 2 % solution  Every 6 hours PRN        06/11/23 1215              Deatra Face, MD 06/11/23 1216

## 2023-06-11 NOTE — ED Notes (Signed)
 460 ml of  gatorade and water given, advised to drink over one hour. Pt agreed. Pt states he feels he has a sore spot In back of throat making him gag. States drinking fluids is not making him nauseated.

## 2023-06-11 NOTE — ED Notes (Signed)
 Pt back from ct, wanting pain and nausea meds. Advised EDP pt needing nausea meds.

## 2023-06-11 NOTE — ED Triage Notes (Addendum)
 Pt c/o lower abdominal pain starting around 0200 this morning with vomiting that will not stop. Per wife pt has attempted to drink fluids multiple times and each time throws up. Unknown if pt has been around other people sick. Pt vomiting in triage but has not vomited since come back to room. Pt is shaking and diaphoretic. CBG 145. Wife states last time pt was like this he had a kidney stone.

## 2023-06-11 NOTE — ED Notes (Signed)
 See triage notes. A/o. Color wnl. Emesis dark in color. Lnbm this am.

## 2023-06-11 NOTE — Discharge Instructions (Addendum)
 We saw you in the ER for the abdominal pain. Our results indicate that you have a kidney stone. We were able to get your pain is relative control, and we can safely send you home.  Take the meds prescribed. Set up an appointment with the Urologist. If the pain is unbearable, you start having fevers, chills, and are unable to keep any meds down - then return to the ER.

## 2023-06-12 ENCOUNTER — Telehealth (HOSPITAL_COMMUNITY): Payer: Self-pay | Admitting: Emergency Medicine

## 2023-06-12 MED ORDER — TIRZEPATIDE 5 MG/0.5ML ~~LOC~~ SOAJ: 5.0000 mg | SUBCUTANEOUS | 0 refills | Status: DC

## 2023-06-12 MED ORDER — TAMSULOSIN HCL 0.4 MG PO CAPS: 0.4000 mg | ORAL_CAPSULE | Freq: Every day | ORAL | 0 refills | Status: DC

## 2023-06-12 MED ORDER — TAMSULOSIN HCL 0.4 MG PO CAPS: 0.4000 mg | ORAL_CAPSULE | Freq: Every day | ORAL | 0 refills | Status: AC

## 2023-06-12 NOTE — Addendum Note (Signed)
Addended by: Christel Mormon E on: 06/12/2023 12:58 PM   Modules accepted: Orders

## 2023-06-12 NOTE — Telephone Encounter (Signed)
Patient, seen in the ED yesterday, called to inform that prescription for tamsulosin was never sent to his pharmacy.  New prescription was sent.

## 2023-06-12 NOTE — Telephone Encounter (Signed)
Nursing staff received a call from the patient indicating that he did not receive Flomax for his kidney stones.  Prescription has been placed by me.  Nursing staff informed that patient can be made aware that his prescription will be ready in 10 minutes from our end.

## 2023-08-20 ENCOUNTER — Encounter: Payer: Self-pay | Admitting: Internal Medicine

## 2023-08-20 ENCOUNTER — Ambulatory Visit: Payer: Managed Care, Other (non HMO) | Admitting: Internal Medicine

## 2023-08-20 VITALS — BP 113/69 | HR 106 | Ht 73.0 in | Wt 187.0 lb

## 2023-08-20 DIAGNOSIS — E1169 Type 2 diabetes mellitus with other specified complication: Secondary | ICD-10-CM

## 2023-08-20 DIAGNOSIS — E119 Type 2 diabetes mellitus without complications: Secondary | ICD-10-CM

## 2023-08-20 DIAGNOSIS — M533 Sacrococcygeal disorders, not elsewhere classified: Secondary | ICD-10-CM | POA: Insufficient documentation

## 2023-08-20 DIAGNOSIS — I152 Hypertension secondary to endocrine disorders: Secondary | ICD-10-CM

## 2023-08-20 DIAGNOSIS — E785 Hyperlipidemia, unspecified: Secondary | ICD-10-CM

## 2023-08-20 DIAGNOSIS — E1159 Type 2 diabetes mellitus with other circulatory complications: Secondary | ICD-10-CM

## 2023-08-20 DIAGNOSIS — Z7985 Long-term (current) use of injectable non-insulin antidiabetic drugs: Secondary | ICD-10-CM

## 2023-08-20 MED ORDER — LISINOPRIL 10 MG PO TABS: 10.0000 mg | ORAL_TABLET | Freq: Every day | ORAL | 3 refills | Status: AC

## 2023-08-20 NOTE — Patient Instructions (Signed)
 It was a pleasure to see you today.  Thank you for giving us  the opportunity to be involved in your care.  Below is a brief recap of your visit and next steps.  We will plan to see you again in 6 months.  Summary No medication changes today Repeat A1c and urine study Lisinopril  refilled Please return to care if the pain on your buttocks does not improve

## 2023-08-20 NOTE — Assessment & Plan Note (Addendum)
 Currently on 5 mg per week of Mounjaro. At last visit he was 241 lbs. He is currently stable with his weight on this dose. Repeat A1c and urinalysis ordered.

## 2023-08-20 NOTE — Progress Notes (Unsigned)
 Established Patient Office Visit  Subjective   Patient ID: Jon Washington, male    DOB: 02/12/1966  Age: 58 y.o. MRN: [id number]  Chief Complaint  Patient presents with   Care Management    Six month follow up   Rectal Pain   Jon Washington returns to care today for routine follow-up.  He was last evaluated by me in October 2024 for his annual physical.  No medication changes were made initially, repeat labs ordered, a referral to sleep medicine placed for management of OSA and candidacy evaluation for inspire.  Ultimately, Mounjaro was added in the setting of poorly controlled diabetes mellitus.  ER presentation on 2/10 endorsing emesis.  He was ultimately found to have a 4 mm mid left ureteral calculus.  Treated supportively.  There have otherwise been no acute interval events.  Today he reports feeling fairly well.  His acute concern is a painful lump along his sacrum that has been present for the last 2-3 weeks.  He recently changed jobs and has been sitting more often.  Pain is worse when sitting and laying back and when lying flat on his back.  He does not have any additional concerns to discuss.  Past Medical History:  Diagnosis Date   Chronic back pain    Colon cancer screening 06/13/2022   HLD (hyperlipidemia) 06/13/2016   Hypertension    Sleep apnea    Past Surgical History:  Procedure Laterality Date   APPENDECTOMY  1974   EXTRACORPOREAL SHOCK WAVE LITHOTRIPSY Right 08/29/2022   Procedure: EXTRACORPOREAL SHOCK WAVE LITHOTRIPSY (ESWL);  Surgeon: Marco Severs, MD;  Location: [facility];  Service: Urology;  Laterality: Right;   HERNIA REPAIR  1978   Social History   Tobacco Use   Smoking status: Former    Current packs/day: 0.00    Average packs/day: 1 pack/day for 17.0 years (17.0 ttl pk-yrs)    Types: Cigarettes    Start date: 05/01/1980    Quit date: 05/01/1997    Years since quitting: 26.3   Smokeless tobacco: Never  Vaping Use   Vaping status: Former   Devices:  started in 1982 quit in 1994  Substance Use Topics   Alcohol use: No   Drug use: Yes    Types: Marijuana   Family History  Problem Relation Age of Onset   Thyroid disease Mother    Alcohol abuse Father    Arthritis Father    Cancer Father        lymphoma   COPD Father    Heart disease Father        irreg beat   Heart disease Sister 42       CAD   Hypertension Sister    Cancer Sister        hodgkins, breast, skin   Breast cancer Sister    Heart disease Maternal Uncle    Mental illness Maternal Grandmother        social anxiety   Heart disease Paternal Grandfather 35       heart attack   No Known Allergies  Review of Systems  Constitutional:  Negative for chills and fever.  HENT:  Negative for sore throat.   Respiratory:  Negative for cough and shortness of breath.   Cardiovascular:  Negative for chest pain, palpitations and leg swelling.  Gastrointestinal:  Negative for abdominal pain, blood in stool, constipation, diarrhea, nausea and vomiting.  Genitourinary:  Negative for dysuria, frequency, hematuria and urgency.  Musculoskeletal:  Positive for  back pain (soreness x 2-3 weeks in sacral region). Negative for myalgias.  Skin:  Negative for itching and rash.  Neurological:  Negative for dizziness and headaches.  Psychiatric/Behavioral:  Negative for depression and suicidal ideas.      Objective:     BP 113/69   Pulse (!) 106   Ht 6\' 1"  (1.854 m)   Wt 187 lb (84.8 kg)   SpO2 96%   BMI 24.67 kg/m  BP Readings from Last 3 Encounters:  08/20/23 113/69  06/11/23 117/73  02/19/23 115/73   Physical Exam Vitals reviewed.  Constitutional:      General: He is not in acute distress.    Appearance: Normal appearance. He is normal weight. He is not ill-appearing.  HENT:     Head: Normocephalic and atraumatic.     Right Ear: External ear normal.     Left Ear: External ear normal.     Nose: Nose normal. No congestion or rhinorrhea.     Mouth/Throat:     Mouth:  Mucous membranes are moist.     Pharynx: Oropharynx is clear.  Eyes:     General: No scleral icterus.    Extraocular Movements: Extraocular movements intact.     Conjunctiva/sclera: Conjunctivae normal.     Pupils: Pupils are equal, round, and reactive to light.  Cardiovascular:     Rate and Rhythm: Normal rate and regular rhythm.     Pulses: Normal pulses.     Heart sounds: Normal heart sounds. No murmur heard. Pulmonary:     Effort: Pulmonary effort is normal.     Breath sounds: Normal breath sounds. No wheezing, rhonchi or rales.  Abdominal:     General: Abdomen is flat. Bowel sounds are normal. There is no distension.     Palpations: Abdomen is soft.     Tenderness: There is no abdominal tenderness.  Musculoskeletal:        General: Deformity (There is a palpable, mildly erythematous mass along the gluteal cleft.  There is no significant tenderness with palpation.  No purulence appreciated.) present. No swelling. Normal range of motion.     Cervical back: Normal range of motion.  Skin:    General: Skin is warm and dry.     Capillary Refill: Capillary refill takes less than 2 seconds.  Neurological:     General: No focal deficit present.     Mental Status: He is alert and oriented to person, place, and time.     Motor: No weakness.  Psychiatric:        Mood and Affect: Mood normal.        Behavior: Behavior normal.        Thought Content: Thought content normal.   Last CBC Lab Results  Component Value Date   WBC 16.7 (H) 06/11/2023   HGB 15.5 06/11/2023   HCT 46.0 06/11/2023   MCV 89.7 06/11/2023   MCH 30.2 06/11/2023   RDW 13.0 06/11/2023   PLT 288 06/11/2023   Last metabolic panel Lab Results  Component Value Date   GLUCOSE 172 (H) 06/11/2023   NA 138 06/11/2023   K 3.9 06/11/2023   CL 103 06/11/2023   CO2 21 (L) 06/11/2023   BUN 16 06/11/2023   CREATININE 1.15 06/11/2023   GFRNONAA >60 06/11/2023   CALCIUM  10.1 06/11/2023   PROT 8.1 06/11/2023   ALBUMIN  4.6 06/11/2023   LABGLOB 2.5 06/09/2022   AGRATIO 1.8 06/09/2022   BILITOT 0.8 06/11/2023   ALKPHOS 55 06/11/2023  AST 18 06/11/2023   ALT 11 06/11/2023   ANIONGAP 14 06/11/2023   Last lipids Lab Results  Component Value Date   CHOL 129 02/21/2023   HDL 44 02/21/2023   LDLCALC 68 02/21/2023   TRIG 87 02/21/2023   CHOLHDL 2.9 02/21/2023   Last hemoglobin A1c Lab Results  Component Value Date   HGBA1C 6.0 (H) 05/25/2023   Last thyroid functions Lab Results  Component Value Date   TSH 1.210 06/09/2022   Last vitamin D  Lab Results  Component Value Date   VD25OH 27.2 (L) 10/19/2022   Last vitamin B12 and Folate Lab Results  Component Value Date   VITAMINB12 325 06/09/2022   FOLATE 8.8 06/09/2022     Assessment & Plan:   Problem List Items Addressed This Visit       Hypertension associated with diabetes (HCC)   Remains adequately controlled with lisinopril  10 mg daily.  Refill provided today.      Type 2 diabetes mellitus without complications (HCC) - Primary   A1c 8.1 on labs from October 2024.  Mounjaro was started in light of this result.  Currently prescribed 5 mg weekly.  A1c improved to 6.0 on labs from January.  He has also lost 54 pounds since starting Mounjaro and was congratulated on his progress.  Repeat A1c and urine microalbumin/creatinine ratio ordered today.        HLD (hyperlipidemia)   Currently prescribed atorvastatin  40 mg daily.  Lipid panel updated in October 2024 reflects adequate control on this regimen.      Sacral pain   His acute concern today is a painful mass along the sacral region present x 2-3 weeks.  Pain is worse with sitting and leaning backwards or with lying on his back.  He has recently changed jobs and has been sitting more often.  On exam there is a palpable, mildly erythematous mass along the gluteal cleft.  It is not significantly tender to palpation.  No purulence appreciated. Question bruising vs early stage pressure  injury vs soft tissue infection.  Contributing factors are sitting more frequently with work and > 50 pound weight loss within the last 6 months.  We discussed conservative treatment measures for now, namely offloading, however he was instructed to return to care if pain worsens or fails to improve with conservative management.      Return in about 6 months (around 02/19/2024) for CPE.   Tobi Fortes, MD

## 2023-08-20 NOTE — Assessment & Plan Note (Addendum)
 His acute concern today is a painful mass along the sacral region present x 2-3 weeks.  Pain is worse with sitting and leaning backwards or with lying on his back.  He has recently changed jobs and has been sitting more often.  On exam there is a palpable, mildly erythematous mass along the gluteal cleft.  It is not significantly tender to palpation.  No purulence appreciated. Question bruising vs early stage pressure injury vs soft tissue infection.  Contributing factors are sitting more frequently with work and > 50 pound weight loss within the last 6 months.  We discussed conservative treatment measures for now, namely offloading, however he was instructed to return to care if pain worsens or fails to improve with conservative management.

## 2023-08-20 NOTE — Assessment & Plan Note (Addendum)
 Well controlled today on Lisinopril  10 mg. No medication changes necessary.

## 2023-08-21 ENCOUNTER — Encounter: Payer: Self-pay | Admitting: Internal Medicine

## 2023-08-21 NOTE — Assessment & Plan Note (Signed)
 Currently prescribed atorvastatin  40 mg daily.  Lipid panel updated in October 2024 reflects adequate control on this regimen.

## 2023-08-22 ENCOUNTER — Encounter: Payer: Self-pay | Admitting: Internal Medicine

## 2023-08-22 ENCOUNTER — Telehealth: Payer: Self-pay | Admitting: Internal Medicine

## 2023-08-22 LAB — MICROALBUMIN / CREATININE URINE RATIO
Creatinine, Urine: 243.5 mg/dL
Microalb/Creat Ratio: 5 mg/g{creat} (ref 0–29)
Microalbumin, Urine: 11.9 ug/mL

## 2023-08-22 NOTE — Telephone Encounter (Signed)
 Left detailed vm

## 2023-08-22 NOTE — Telephone Encounter (Signed)
 Copied from CRM [id number]. Topic: Clinical - Lab/Test Results >> Aug 21, 2023  8:44 AM [employer] wrote: Reason for CRM: Patient called to get A1C results. Labs were not ready for review.

## 2023-08-23 LAB — BAYER DCA HB A1C WAIVED: HB A1C (BAYER DCA - WAIVED): 5.5 % (ref 4.8–5.6)

## 2023-08-25 ENCOUNTER — Other Ambulatory Visit: Payer: Self-pay | Admitting: Internal Medicine

## 2023-08-25 DIAGNOSIS — E119 Type 2 diabetes mellitus without complications: Secondary | ICD-10-CM

## 2023-08-27 MED ORDER — TIRZEPATIDE 5 MG/0.5ML ~~LOC~~ SOAJ: 5.0000 mg | SUBCUTANEOUS | 0 refills | Status: DC

## 2023-08-31 ENCOUNTER — Telehealth: Payer: Self-pay | Admitting: Pharmacy Technician

## 2023-08-31 ENCOUNTER — Other Ambulatory Visit (HOSPITAL_COMMUNITY): Payer: Self-pay

## 2023-08-31 NOTE — Telephone Encounter (Signed)
 Pharmacy Patient Advocate Encounter   Received notification from CoverMyMeds that prior authorization for Mounjaro 5MG /0.5ML auto-injectors is required/requested.   Insurance verification completed.   The patient is insured through [employer] .   Per test claim: PA required; PA submitted to above mentioned insurance via CoverMyMeds Key/confirmation #/EOC [id number] Status is pending

## 2023-09-06 ENCOUNTER — Other Ambulatory Visit (HOSPITAL_COMMUNITY): Payer: Self-pay

## 2023-09-06 NOTE — Telephone Encounter (Signed)
 Pharmacy Patient Advocate Encounter  Received notification from [employer] that Prior Authorization for Mounjaro 5MG /0.5ML auto-injectors has been APPROVED from 08/31/2023 to 09/05/2024. Ran test claim, Copay is $25.00. This test claim was processed through [facility]- copay amounts may vary at other pharmacies due to pharmacy/plan contracts, or as the patient moves through the different stages of their insurance plan.   PA #/Case ID/Reference #: [id number]

## 2023-11-07 ENCOUNTER — Other Ambulatory Visit: Payer: Self-pay | Admitting: Internal Medicine

## 2023-11-07 DIAGNOSIS — E782 Mixed hyperlipidemia: Secondary | ICD-10-CM

## 2023-12-21 ENCOUNTER — Encounter: Payer: Self-pay | Admitting: Radiology

## 2024-02-19 ENCOUNTER — Ambulatory Visit

## 2024-02-19 VITALS — BP 127/72 | HR 80 | Ht 73.0 in | Wt 191.0 lb

## 2024-02-19 DIAGNOSIS — Z7985 Long-term (current) use of injectable non-insulin antidiabetic drugs: Secondary | ICD-10-CM

## 2024-02-19 DIAGNOSIS — E785 Hyperlipidemia, unspecified: Secondary | ICD-10-CM

## 2024-02-19 DIAGNOSIS — E663 Overweight: Secondary | ICD-10-CM

## 2024-02-19 DIAGNOSIS — E1159 Type 2 diabetes mellitus with other circulatory complications: Secondary | ICD-10-CM

## 2024-02-19 DIAGNOSIS — Z23 Encounter for immunization: Secondary | ICD-10-CM | POA: Diagnosis not present

## 2024-02-19 DIAGNOSIS — I152 Hypertension secondary to endocrine disorders: Secondary | ICD-10-CM

## 2024-02-19 DIAGNOSIS — E119 Type 2 diabetes mellitus without complications: Secondary | ICD-10-CM

## 2024-02-19 NOTE — Progress Notes (Signed)
 Established Patient Office Visit  Subjective   Patient ID: Jon Washington, male    DOB: 09/11/1965  Age: 58 y.o. MRN: [id number]  Chief Complaint  Patient presents with   Medical Management of Chronic Issues    6  Month follow up     HPI Discussed the use of AI scribe software for clinical note transcription with the patient, who gave verbal consent to proceed.  History of Present Illness   Jon Washington is a 58 year old male who presents for a follow-up visit after weight loss on Mounjaro .  Pharmacologic weight management - Significant weight loss achieved while taking Mounjaro  - No adverse effects or concerns related to Mounjaro  therapy - Satisfied with current dose and does not wish to increase  Metabolic monitoring - Due for blood work at this visit - Ate and drank this morning, not fasting for labs - Previous laboratory results included HbA1c of 5.5 - Interested in current blood work results, particularly in context of recent weight loss      Patient Active Problem List   Diagnosis Date Noted   Sacral pain 08/20/2023   Hypertension associated with diabetes (HCC) 05/02/2021   Obstructive sleep apnea 03/22/2021   Large tonsils 11/10/2020   Solar keratosis 06/30/2016   Type 2 diabetes mellitus without complications (HCC) 06/13/2016   HLD (hyperlipidemia) 06/13/2016   Vitamin D  deficiency 06/13/2016   Family history of heart disease in male family member before age 39 06/12/2016   Overweight (BMI 25.0-29.9) 06/12/2016    ROS    Objective:     BP 127/72   Pulse 80   Ht 6' 1 (1.854 m)   Wt 191 lb 0.6 oz (86.7 kg)   SpO2 96%   BMI 25.20 kg/m  BP Readings from Last 3 Encounters:  02/19/24 127/72  08/20/23 113/69  06/11/23 117/73   Wt Readings from Last 3 Encounters:  02/19/24 191 lb 0.6 oz (86.7 kg)  08/20/23 187 lb (84.8 kg)  06/11/23 196 lb 6.4 oz (89.1 kg)     Physical Exam Vitals and nursing note reviewed.  Constitutional:       Appearance: Normal appearance.  HENT:     Head: Normocephalic.  Eyes:     Extraocular Movements: Extraocular movements intact.     Pupils: Pupils are equal, round, and reactive to light.  Cardiovascular:     Rate and Rhythm: Normal rate and regular rhythm.  Pulmonary:     Effort: Pulmonary effort is normal.     Breath sounds: Normal breath sounds.  Musculoskeletal:     Cervical back: Normal range of motion and neck supple.  Neurological:     Mental Status: He is alert and oriented to person, place, and time.  Psychiatric:        Mood and Affect: Mood normal.        Thought Content: Thought content normal.        The 10-year ASCVD risk score (Arnett DK, et al., 2019) is: 13.1%    Assessment & Plan:   Problem List Items Addressed This Visit       Cardiovascular and Mediastinum   Hypertension associated with diabetes (HCC)   Remains adequately controlled with lisinopril  10 mg daily.  Refill provided today.        Endocrine   Type 2 diabetes mellitus without complications (HCC) - Primary   A1c at 5.5 indicates good control. Weight loss may improve management. Significant weight loss with Mounjaro .  - Order blood work  to assess current diabetes control. - Continue current dose of Mounjaro . - Follow up in 6 months unless earlier evaluation is needed. - Monitor weight and consider dose adjustment if necessary.      Relevant Orders   CMP14+EGFR (Completed)   HgB A1c (Completed)     Other   Overweight (BMI 25.0-29.9) (Chronic)   Significant weight loss with Mounjaro . Satisfied with current dose. - Continue current dose of Mounjaro . - Monitor weight and consider dose adjustment if necessary.      HLD (hyperlipidemia)   Currently prescribed atorvastatin  40 mg daily.  Lipid panel updated today.      Relevant Orders   CMP14+EGFR (Completed)   Lipid Profile (Completed)   Other Visit Diagnoses       Encounter for immunization       Relevant Orders   Flu vaccine  trivalent PF, 6mos and older(Flulaval,Afluria,Fluarix,Fluzone) (Completed)       No follow-ups on file.    Leita Longs, FNP

## 2024-02-20 LAB — CMP14+EGFR
ALT: 7 IU/L (ref 0–44)
AST: 16 IU/L (ref 0–40)
Albumin: 4.3 g/dL (ref 3.8–4.9)
Alkaline Phosphatase: 66 IU/L (ref 47–123)
BUN/Creatinine Ratio: 12 (ref 9–20)
BUN: 11 mg/dL (ref 6–24)
Bilirubin Total: 0.3 mg/dL (ref 0.0–1.2)
CO2: 24 mmol/L (ref 20–29)
Calcium: 9.4 mg/dL (ref 8.7–10.2)
Chloride: 103 mmol/L (ref 96–106)
Creatinine, Ser: 0.89 mg/dL (ref 0.76–1.27)
Globulin, Total: 2.3 g/dL (ref 1.5–4.5)
Glucose: 79 mg/dL (ref 70–99)
Potassium: 4 mmol/L (ref 3.5–5.2)
Sodium: 138 mmol/L (ref 134–144)
Total Protein: 6.6 g/dL (ref 6.0–8.5)
eGFR: 99 mL/min/1.73 (ref >59)

## 2024-02-20 LAB — LIPID PANEL
Chol/HDL Ratio: 3.3 ratio (ref 0.0–5.0)
Cholesterol, Total: 183 mg/dL (ref 100–199)
HDL: 56 mg/dL (ref >39)
LDL Chol Calc (NIH): 103 mg/dL — ABNORMAL HIGH (ref 0–99)
Triglycerides: 139 mg/dL (ref 0–149)
VLDL Cholesterol Cal: 24 mg/dL (ref 5–40)

## 2024-02-20 LAB — HEMOGLOBIN A1C
Est. average glucose Bld gHb Est-mCnc: 111 mg/dL
Hgb A1c MFr Bld: 5.5 % (ref 4.8–5.6)

## 2024-02-24 ENCOUNTER — Ambulatory Visit: Payer: Self-pay

## 2024-02-24 NOTE — Assessment & Plan Note (Addendum)
 A1c at 5.5 indicates good control. Weight loss may improve management. Significant weight loss with Mounjaro .  - Order blood work to assess current diabetes control. - Continue current dose of Mounjaro . - Follow up in 6 months unless earlier evaluation is needed. - Monitor weight and consider dose adjustment if necessary.

## 2024-02-24 NOTE — Assessment & Plan Note (Signed)
 Well controlled today on Lisinopril  10 mg. No medication changes necessary.

## 2024-02-24 NOTE — Assessment & Plan Note (Signed)
 Currently prescribed atorvastatin  40 mg daily.  Lipid panel updated today.

## 2024-02-24 NOTE — Assessment & Plan Note (Signed)
 Significant weight loss with Mounjaro . Satisfied with current dose. - Continue current dose of Mounjaro . - Monitor weight and consider dose adjustment if necessary.

## 2024-03-03 ENCOUNTER — Encounter: Payer: Self-pay | Admitting: Radiology

## 2024-04-26 ENCOUNTER — Other Ambulatory Visit: Payer: Self-pay | Admitting: Internal Medicine

## 2024-04-26 DIAGNOSIS — E119 Type 2 diabetes mellitus without complications: Secondary | ICD-10-CM

## 2024-06-03 ENCOUNTER — Other Ambulatory Visit: Payer: Self-pay

## 2024-06-03 DIAGNOSIS — E119 Type 2 diabetes mellitus without complications: Secondary | ICD-10-CM

## 2024-08-19 ENCOUNTER — Ambulatory Visit
